# Patient Record
Sex: Male | Born: 1945 | Race: White | Hispanic: No | Smoking: Former smoker
Health system: Southern US, Community
[De-identification: ages and names within clinical notes are randomized; demographics above are authoritative.]

## PROBLEM LIST (undated history)

## (undated) DIAGNOSIS — Z720 Tobacco use: Secondary | ICD-10-CM

## (undated) DIAGNOSIS — I499 Cardiac arrhythmia, unspecified: Secondary | ICD-10-CM

## (undated) DIAGNOSIS — E785 Hyperlipidemia, unspecified: Secondary | ICD-10-CM

## (undated) DIAGNOSIS — S838X9A Sprain of other specified parts of unspecified knee, initial encounter: Secondary | ICD-10-CM

## (undated) DIAGNOSIS — Z9889 Other specified postprocedural states: Secondary | ICD-10-CM

## (undated) DIAGNOSIS — S83242A Other tear of medial meniscus, current injury, left knee, initial encounter: Secondary | ICD-10-CM

## (undated) DIAGNOSIS — I509 Heart failure, unspecified: Secondary | ICD-10-CM

## (undated) DIAGNOSIS — I2510 Atherosclerotic heart disease of native coronary artery without angina pectoris: Secondary | ICD-10-CM

## (undated) DIAGNOSIS — I739 Peripheral vascular disease, unspecified: Secondary | ICD-10-CM

## (undated) DIAGNOSIS — Z95828 Presence of other vascular implants and grafts: Secondary | ICD-10-CM

## (undated) DIAGNOSIS — I252 Old myocardial infarction: Secondary | ICD-10-CM

## (undated) DIAGNOSIS — I10 Essential (primary) hypertension: Secondary | ICD-10-CM

## (undated) DIAGNOSIS — G709 Myoneural disorder, unspecified: Secondary | ICD-10-CM

## (undated) DIAGNOSIS — F329 Major depressive disorder, single episode, unspecified: Secondary | ICD-10-CM

## (undated) DIAGNOSIS — R0602 Shortness of breath: Secondary | ICD-10-CM

## (undated) DIAGNOSIS — F32A Depression, unspecified: Secondary | ICD-10-CM

## (undated) DIAGNOSIS — F419 Anxiety disorder, unspecified: Secondary | ICD-10-CM

## (undated) DIAGNOSIS — N289 Disorder of kidney and ureter, unspecified: Secondary | ICD-10-CM

## (undated) DIAGNOSIS — Z951 Presence of aortocoronary bypass graft: Secondary | ICD-10-CM

## (undated) DIAGNOSIS — M1990 Unspecified osteoarthritis, unspecified site: Secondary | ICD-10-CM

## (undated) HISTORY — DX: Hyperlipidemia, unspecified: E78.5

## (undated) HISTORY — DX: Essential (primary) hypertension: I10

## (undated) HISTORY — DX: Heart failure, unspecified: I50.9

## (undated) HISTORY — PX: EYE SURGERY: SHX253

## (undated) HISTORY — PX: CARDIAC CATHETERIZATION: SHX172

## (undated) HISTORY — PX: OTHER SURGICAL HISTORY: SHX169

## (undated) HISTORY — DX: Atherosclerotic heart disease of native coronary artery without angina pectoris: I25.10

## (undated) HISTORY — DX: Peripheral vascular disease, unspecified: I73.9

## (undated) HISTORY — DX: Tobacco use: Z72.0

## (undated) HISTORY — DX: Other specified postprocedural states: Z98.890

## (undated) HISTORY — PX: LUMBAR DISC SURGERY: SHX700

## (undated) HISTORY — DX: Shortness of breath: R06.02

## (undated) HISTORY — PX: JOINT REPLACEMENT: SHX530

## (undated) HISTORY — PX: CATARACT EXTRACTION W/ INTRAOCULAR LENS IMPLANT: SHX1309

## (undated) HISTORY — DX: Disorder of kidney and ureter, unspecified: N28.9

## (undated) HISTORY — PX: CATARACT EXTRACTION: SUR2

---

## 1999-03-20 ENCOUNTER — Encounter: Payer: Self-pay | Admitting: Specialist

## 1999-03-20 ENCOUNTER — Ambulatory Visit (HOSPITAL_COMMUNITY): Admission: RE | Admit: 1999-03-20 | Discharge: 1999-03-20 | Payer: Self-pay | Admitting: Specialist

## 2000-08-01 ENCOUNTER — Encounter: Payer: Self-pay | Admitting: Specialist

## 2000-08-01 ENCOUNTER — Ambulatory Visit (HOSPITAL_COMMUNITY): Admission: RE | Admit: 2000-08-01 | Discharge: 2000-08-01 | Payer: Self-pay | Admitting: Specialist

## 2000-11-14 ENCOUNTER — Encounter: Payer: Self-pay | Admitting: Specialist

## 2000-11-22 ENCOUNTER — Inpatient Hospital Stay (HOSPITAL_COMMUNITY): Admission: RE | Admit: 2000-11-22 | Discharge: 2000-11-24 | Payer: Self-pay | Admitting: Specialist

## 2009-01-05 DIAGNOSIS — I252 Old myocardial infarction: Secondary | ICD-10-CM

## 2009-01-05 HISTORY — DX: Old myocardial infarction: I25.2

## 2009-01-19 ENCOUNTER — Ambulatory Visit: Payer: Self-pay | Admitting: Internal Medicine

## 2009-01-19 ENCOUNTER — Inpatient Hospital Stay (HOSPITAL_COMMUNITY): Admission: EM | Admit: 2009-01-19 | Discharge: 2009-02-07 | Payer: Self-pay | Admitting: Cardiology

## 2009-01-19 ENCOUNTER — Ambulatory Visit: Payer: Self-pay | Admitting: Cardiovascular Disease

## 2009-01-19 ENCOUNTER — Ambulatory Visit: Payer: Self-pay | Admitting: Gastroenterology

## 2009-01-20 ENCOUNTER — Encounter: Payer: Self-pay | Admitting: Cardiology

## 2009-01-21 ENCOUNTER — Encounter: Payer: Self-pay | Admitting: Cardiovascular Disease

## 2009-01-22 ENCOUNTER — Encounter: Payer: Self-pay | Admitting: Cardiology

## 2009-01-22 DIAGNOSIS — Z9889 Other specified postprocedural states: Secondary | ICD-10-CM

## 2009-01-22 DIAGNOSIS — I10 Essential (primary) hypertension: Secondary | ICD-10-CM

## 2009-01-22 DIAGNOSIS — R0602 Shortness of breath: Secondary | ICD-10-CM | POA: Insufficient documentation

## 2009-01-22 DIAGNOSIS — I739 Peripheral vascular disease, unspecified: Secondary | ICD-10-CM

## 2009-01-23 ENCOUNTER — Encounter: Payer: Self-pay | Admitting: Internal Medicine

## 2009-01-24 ENCOUNTER — Encounter: Payer: Self-pay | Admitting: Internal Medicine

## 2009-01-26 ENCOUNTER — Encounter: Payer: Self-pay | Admitting: Cardiology

## 2009-01-26 ENCOUNTER — Encounter: Payer: Self-pay | Admitting: Gastroenterology

## 2009-01-31 ENCOUNTER — Encounter: Payer: Self-pay | Admitting: Cardiovascular Disease

## 2009-01-31 ENCOUNTER — Ambulatory Visit: Payer: Self-pay | Admitting: Surgery

## 2009-01-31 ENCOUNTER — Encounter: Payer: Self-pay | Admitting: Physician Assistant

## 2009-01-31 ENCOUNTER — Telehealth (INDEPENDENT_AMBULATORY_CARE_PROVIDER_SITE_OTHER): Payer: Self-pay | Admitting: *Deleted

## 2009-02-01 ENCOUNTER — Encounter: Payer: Self-pay | Admitting: Cardiology

## 2009-02-02 ENCOUNTER — Encounter: Payer: Self-pay | Admitting: Gastroenterology

## 2009-02-03 ENCOUNTER — Encounter: Payer: Self-pay | Admitting: Physician Assistant

## 2009-02-03 HISTORY — PX: CORONARY ARTERY BYPASS GRAFT: SHX141

## 2009-02-22 ENCOUNTER — Encounter (INDEPENDENT_AMBULATORY_CARE_PROVIDER_SITE_OTHER): Payer: Self-pay | Admitting: *Deleted

## 2009-02-28 ENCOUNTER — Ambulatory Visit: Payer: Self-pay | Admitting: Cardiovascular Disease

## 2009-02-28 DIAGNOSIS — F17200 Nicotine dependence, unspecified, uncomplicated: Secondary | ICD-10-CM | POA: Insufficient documentation

## 2009-02-28 DIAGNOSIS — I2510 Atherosclerotic heart disease of native coronary artery without angina pectoris: Secondary | ICD-10-CM

## 2009-02-28 DIAGNOSIS — I10 Essential (primary) hypertension: Secondary | ICD-10-CM

## 2009-03-01 ENCOUNTER — Encounter: Admission: RE | Admit: 2009-03-01 | Discharge: 2009-03-01 | Payer: Self-pay | Admitting: Surgery

## 2009-03-07 ENCOUNTER — Ambulatory Visit: Payer: Self-pay | Admitting: Surgery

## 2009-03-08 ENCOUNTER — Telehealth (INDEPENDENT_AMBULATORY_CARE_PROVIDER_SITE_OTHER): Payer: Self-pay | Admitting: *Deleted

## 2009-03-09 ENCOUNTER — Telehealth: Payer: Self-pay | Admitting: Cardiovascular Disease

## 2009-04-01 ENCOUNTER — Telehealth: Payer: Self-pay | Admitting: Cardiovascular Disease

## 2009-08-23 ENCOUNTER — Encounter (INDEPENDENT_AMBULATORY_CARE_PROVIDER_SITE_OTHER): Payer: Self-pay | Admitting: *Deleted

## 2009-09-04 ENCOUNTER — Encounter: Admission: RE | Admit: 2009-09-04 | Discharge: 2009-09-04 | Payer: Self-pay | Admitting: Orthopedic Surgery

## 2009-09-06 ENCOUNTER — Ambulatory Visit: Payer: Self-pay | Admitting: Cardiovascular Disease

## 2009-10-21 ENCOUNTER — Ambulatory Visit: Payer: Self-pay

## 2009-10-21 ENCOUNTER — Encounter: Payer: Self-pay | Admitting: Cardiovascular Disease

## 2010-04-03 ENCOUNTER — Ambulatory Visit: Payer: Self-pay | Admitting: Physician Assistant

## 2010-04-03 DIAGNOSIS — E785 Hyperlipidemia, unspecified: Secondary | ICD-10-CM

## 2010-04-04 ENCOUNTER — Encounter: Payer: Self-pay | Admitting: Physician Assistant

## 2010-04-19 ENCOUNTER — Encounter: Payer: Self-pay | Admitting: Cardiovascular Disease

## 2010-04-19 ENCOUNTER — Ambulatory Visit: Payer: Self-pay | Admitting: Cardiovascular Disease

## 2010-04-27 ENCOUNTER — Encounter: Payer: Self-pay | Admitting: Cardiovascular Disease

## 2010-05-15 ENCOUNTER — Ambulatory Visit: Admission: RE | Admit: 2010-05-15 | Discharge: 2010-05-15 | Payer: Self-pay | Source: Home / Self Care

## 2010-05-15 ENCOUNTER — Other Ambulatory Visit: Payer: Self-pay | Admitting: Cardiovascular Disease

## 2010-05-15 LAB — CBC WITH DIFFERENTIAL/PLATELET
Basophils Absolute: 0.1 10*3/uL (ref 0.0–0.1)
Basophils Relative: 0.7 % (ref 0.0–3.0)
Eosinophils Absolute: 0.1 10*3/uL (ref 0.0–0.7)
Eosinophils Relative: 1.3 % (ref 0.0–5.0)
HCT: 38 % — ABNORMAL LOW (ref 39.0–52.0)
Hemoglobin: 12.6 g/dL — ABNORMAL LOW (ref 13.0–17.0)
Lymphocytes Relative: 30.8 % (ref 12.0–46.0)
Lymphs Abs: 2.2 10*3/uL (ref 0.7–4.0)
MCHC: 33 g/dL (ref 30.0–36.0)
MCV: 84.2 fl (ref 78.0–100.0)
Monocytes Absolute: 0.9 10*3/uL (ref 0.1–1.0)
Monocytes Relative: 12.4 % — ABNORMAL HIGH (ref 3.0–12.0)
Neutro Abs: 3.9 10*3/uL (ref 1.4–7.7)
Neutrophils Relative %: 54.8 % (ref 43.0–77.0)
Platelets: 296 10*3/uL (ref 150.0–400.0)
RBC: 4.52 Mil/uL (ref 4.22–5.81)
RDW: 17.8 % — ABNORMAL HIGH (ref 11.5–14.6)
WBC: 7 10*3/uL (ref 4.5–10.5)

## 2010-05-15 LAB — BASIC METABOLIC PANEL
BUN: 13 mg/dL (ref 6–23)
CO2: 29 mEq/L (ref 19–32)
Calcium: 9.3 mg/dL (ref 8.4–10.5)
Chloride: 102 mEq/L (ref 96–112)
Creatinine, Ser: 1.1 mg/dL (ref 0.4–1.5)
GFR: 68.63 mL/min (ref >60.00)
Glucose, Bld: 122 mg/dL — ABNORMAL HIGH (ref 70–99)
Potassium: 3.9 mEq/L (ref 3.5–5.1)
Sodium: 137 mEq/L (ref 135–145)

## 2010-05-15 LAB — PROTIME-INR
INR: 1 ratio (ref 0.8–1.0)
Prothrombin Time: 10.9 s (ref 10.2–12.4)

## 2010-05-17 ENCOUNTER — Ambulatory Visit (HOSPITAL_COMMUNITY)
Admission: RE | Admit: 2010-05-17 | Discharge: 2010-05-17 | Payer: Self-pay | Source: Home / Self Care | Attending: Cardiovascular Disease | Admitting: Cardiovascular Disease

## 2010-05-19 ENCOUNTER — Encounter: Payer: Self-pay | Admitting: Cardiovascular Disease

## 2010-05-19 ENCOUNTER — Telehealth (INDEPENDENT_AMBULATORY_CARE_PROVIDER_SITE_OTHER): Payer: Self-pay | Admitting: *Deleted

## 2010-05-22 LAB — GLUCOSE, CAPILLARY
Glucose-Capillary: 142 mg/dL — ABNORMAL HIGH (ref 70–99)
Glucose-Capillary: 112 mg/dL — ABNORMAL HIGH (ref 70–99)
Glucose-Capillary: 179 mg/dL — ABNORMAL HIGH (ref 70–99)

## 2010-05-23 ENCOUNTER — Encounter: Payer: Self-pay | Admitting: Cardiovascular Disease

## 2010-05-26 ENCOUNTER — Ambulatory Visit (HOSPITAL_COMMUNITY)
Admission: RE | Admit: 2010-05-26 | Discharge: 2010-05-26 | Payer: Self-pay | Source: Home / Self Care | Attending: Cardiovascular Disease | Admitting: Cardiovascular Disease

## 2010-05-26 HISTORY — PX: RENAL ARTERY ANGIOPLASTY: SHX2317

## 2010-05-29 ENCOUNTER — Ambulatory Visit: Admit: 2010-05-29 | Payer: Self-pay | Admitting: Cardiovascular Disease

## 2010-05-29 LAB — URINALYSIS, ROUTINE W REFLEX MICROSCOPIC
Ketones, ur: NEGATIVE mg/dL
Urine Glucose, Fasting: NEGATIVE mg/dL
pH: 6.5 (ref 5.0–8.0)

## 2010-05-29 LAB — CBC
HCT: 36.5 % — ABNORMAL LOW (ref 39.0–52.0)
Hemoglobin: 11.6 g/dL — ABNORMAL LOW (ref 13.0–17.0)
MCH: 26.3 pg (ref 26.0–34.0)
MCHC: 31.8 g/dL (ref 30.0–36.0)
RBC: 4.41 MIL/uL (ref 4.22–5.81)
RDW: 16.6 % — ABNORMAL HIGH (ref 11.5–15.5)

## 2010-05-29 LAB — DIFFERENTIAL
Basophils Absolute: 0 10*3/uL (ref 0.0–0.1)
Basophils Relative: 0 % (ref 0–1)
Lymphs Abs: 2.1 10*3/uL (ref 0.7–4.0)
Monocytes Absolute: 0.6 10*3/uL (ref 0.1–1.0)
Monocytes Relative: 12 % (ref 3–12)
Neutro Abs: 2.3 10*3/uL (ref 1.7–7.7)

## 2010-05-29 LAB — BLOOD GAS, ARTERIAL
Patient temperature: 98.6
TCO2: 27.7 mmol/L (ref 0–100)

## 2010-05-29 LAB — COMPREHENSIVE METABOLIC PANEL
AST: 19 U/L (ref 0–37)
Alkaline Phosphatase: 48 U/L (ref 39–117)
BUN: 9 mg/dL (ref 6–23)
Creatinine, Ser: 1.04 mg/dL (ref 0.4–1.5)
Glucose, Bld: 99 mg/dL (ref 70–99)
Total Bilirubin: 0.5 mg/dL (ref 0.3–1.2)

## 2010-05-29 LAB — PROTIME-INR
INR: 1 (ref 0.00–1.49)
Prothrombin Time: 13.4 seconds (ref 11.6–15.2)

## 2010-05-29 LAB — APTT: aPTT: 64 seconds — ABNORMAL HIGH (ref 24–37)

## 2010-05-31 ENCOUNTER — Inpatient Hospital Stay (HOSPITAL_COMMUNITY)
Admission: RE | Admit: 2010-05-31 | Discharge: 2010-06-03 | Payer: Self-pay | Source: Home / Self Care | Attending: Vascular Surgery | Admitting: Vascular Surgery

## 2010-05-31 HISTORY — PX: OTHER SURGICAL HISTORY: SHX169

## 2010-05-31 LAB — GLUCOSE, CAPILLARY
Glucose-Capillary: 119 mg/dL — ABNORMAL HIGH (ref 70–99)
Glucose-Capillary: 137 mg/dL — ABNORMAL HIGH (ref 70–99)

## 2010-05-31 NOTE — Procedures (Addendum)
NAME:  Austin Diaz, Austin Diaz NO.:  [id number]  MEDICAL RECORD NO.:  [id number]          PATIENT TYPE:  AMB  LOCATION:  SDS                          FACILITY:  [facility]  PHYSICIAN:  Verne Carrow, MDDATE OF BIRTH:  1945-05-18  DATE OF PROCEDURE:  09/21/1945 DATE OF DISCHARGE:  05/26/2010                   PERIPHERAL VASCULAR INVASIVE PROCEDURE   PRIMARY CARE PHYSICIAN:  Dr. Olena Leatherwood at [facility].  PRIMARY CARDIOLOGIST:  Learta Codding, MD,FACC  PROCEDURES PERFORMED:  Percutaneous transluminal angioplasty with placement of a stent in the right common iliac artery.  OPERATOR:  Verne Carrow, MD  INDICATIONS:  This is a 65 year old Caucasian male with a history of hypertension, coronary artery disease, status post two-vessel coronary artery bypass grafting surgery in September 2010 and severe peripheral vascular disease who has undergone prior renal artery bypass as well as bilateral carotid endarterectomy who I saw in the office last year with complaints of lower extremity pain with ambulation.  The patient's pain is recently worsened.  His noninvasive studies showed severe stable disease with ABI of 0.3 on the left and 0.7 on the right.  We brought him in last week for a distal aortogram with bilateral lower extremity runoff.  He was found to have severe stenosis in the right common iliac artery and a total occlusion of a left common iliac artery.  Dr. Jerilee Field from Vascular Surgery performed a consultation on the patient today of his lower extremity runoff.  He felt that the most likely thing that would benefit this patient would be right to left femoral to femoral bypass surgery with left femoral to popliteal bypass.  Dr. Candie Chroman daughter would also be best if I brought this gentleman back today to perform stenting procedure of the right common iliac artery. His bypass surgery is planned for May 31, 2010, per Dr.  [name].  DETAILS OF PROCEDURE:  The patient was brought to the main peripheral vascular laboratory after signing informed consent for the procedure. The right groin was prepped and draped in sterile fashion.  Lidocaine 1% was used for local anesthesia.  A 7-French sheath was inserted into the right femoral artery without difficulty.  We initially placed a pigtail catheter in the distal aorta, performed angiography of the bilateral iliac arteries.  As stated before, the left common iliac artery is occluded at the ostium.  The right common iliac artery has high-grade disease in the proximal portion just beyond the ostium.  After initial angiography was performed, we then carefully positioned a 7.0 mm x 28 mm Cordis genesis stent.  This was a balloon expandable stent that was deployed without difficulty.  There was an excellent angiographic result with no evidence of dissection or edge-vessel tears.  We performed a pressure pullback both proximal and distal to the stented segment and there was no pressure gradient.  The patient tolerated the procedure well.  Of note, we did use 5000 units of intravenous heparin initially. ACT was 210 just before the stenting procedure.  The patient was given additional 2000 units of intravenous heparin.  The patient has taken to the recovery room in stable condition.  HEMODYNAMIC FINDINGS:  Central aortic pressure 119/53.  IMPRESSION:  Severe peripheral arterial disease with successful stenting of the right common iliac artery.  PLAN:  For Dr. Jerilee Field to perform right to left femoral to femoral bypass with left femoral to left popliteal bypass on May 31, 2010.  RECOMMENDATIONS:  The patient will be continued on aspirin 325 mg once daily.  We will not start him on Plavix as he has an upcoming surgery in 6 days.  We will watch the patient closely after the procedure today and if he has no vascular complications from arterial access, we  will discharge him home tonight.     Verne Carrow, MD     CM/MEDQ  D:  05/26/2010  T:  05/27/2010  Job:  [id number]  cc:   Dr. Elder Love, MD,FACC  Electronically Signed by Verne Carrow MD on 05/31/2010 04:11:04 PM

## 2010-06-01 LAB — BASIC METABOLIC PANEL
Calcium: 7.6 mg/dL — ABNORMAL LOW (ref 8.4–10.5)
GFR calc non Af Amer: 56 mL/min — ABNORMAL LOW (ref >60)
Glucose, Bld: 94 mg/dL (ref 70–99)
Potassium: 3.7 mEq/L (ref 3.5–5.1)
Sodium: 134 mEq/L — ABNORMAL LOW (ref 135–145)

## 2010-06-01 LAB — GLUCOSE, CAPILLARY
Glucose-Capillary: 142 mg/dL — ABNORMAL HIGH (ref 70–99)
Glucose-Capillary: 83 mg/dL (ref 70–99)
Glucose-Capillary: 90 mg/dL (ref 70–99)
Glucose-Capillary: 131 mg/dL — ABNORMAL HIGH (ref 70–99)

## 2010-06-01 LAB — CBC
HCT: 28.1 % — ABNORMAL LOW (ref 39.0–52.0)
MCHC: 32 g/dL (ref 30.0–36.0)
Platelets: 173 10*3/uL (ref 150–400)
RDW: 16.9 % — ABNORMAL HIGH (ref 11.5–15.5)
WBC: 7 10*3/uL (ref 4.0–10.5)

## 2010-06-01 LAB — POCT ACTIVATED CLOTTING TIME
Activated Clotting Time: 211 seconds
Activated Clotting Time: 199 seconds
Activated Clotting Time: 175 seconds
Activated Clotting Time: 175 seconds

## 2010-06-02 LAB — GLUCOSE, CAPILLARY
Glucose-Capillary: 87 mg/dL (ref 70–99)
Glucose-Capillary: 90 mg/dL (ref 70–99)

## 2010-06-03 LAB — CROSSMATCH
ABO/RH(D): A POS
Antibody Screen: NEGATIVE
Unit division: 0
Unit division: 0

## 2010-06-03 LAB — GLUCOSE, CAPILLARY: Glucose-Capillary: 112 mg/dL — ABNORMAL HIGH (ref 70–99)

## 2010-06-05 NOTE — Op Note (Signed)
NAME:  Austin Diaz, Austin Diaz NO.:  [id number]  MEDICAL RECORD NO.:  [id number]          PATIENT TYPE:  INP  LOCATION:  3311                         FACILITY:  [facility]  PHYSICIAN:  Quita Skye. [name], M.D.  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  OPERATIONS: 1. Right external iliac and common femoral endarterectomy with Dacron     patch angioplasty. 2. Right-to-left femoral-femoral bypass using an 8-mm Hemashield     Dacron graft. 3. Left femoral-to-popliteal (above-knee) bypass using a non-reversed     translocated saphenous vein graft, left leg. 4. Intraoperative arteriogram, left leg.  SURGEON:  Quita Skye. [name], MD  FIRST ASSISTANT:  Della Goo, PA-C  ANESTHESIA:  General endotracheal.  PROCEDURE:  The patient was taken to the operating room, placed in the supine position, at which time, satisfactory general endotracheal anesthesia was administered.  The right groin and entire left leg were prepped with Betadine scrub and solution and draped in a routine sterile manner.  A longitudinal incision was made in the right inguinal area, common superficial and profunda femoris arteries were dissected free. There was diffuse calcific disease throughout the common femoral artery with a diminished pulse.  The patient had had a proximal common iliac angioplasty and stent procedure performed about 1 week previously. Attention was turned to the left leg where there was a longitudinal incision made in the groin.  The common superficial and profunda femoris arteries were dissected free.  Left common femoral artery did have a soft spot anteriorly, but it was pulseless.   Saphenous vein was exposed at the saphenofemoral junction, dissected free to the knee through multiple incisions along the medial aspect of the left leg.  Its branch was ligated with a 4-0 silk ties and divided.  It was removed gently, dilated with heparinized saline, and marked for orientation purposes. Popliteal artery was exposed in the above-knee position where it was a soft vessel and widely patent on the angiogram.  A subfascial anatomic tunnel and a suprapubic tunnel were created and the patient was heparinized.  Attention was turned to the right side where the external iliac was occluded well above the inguinal ligament and the superficial femoral and profunda were occluded distally.  Longitudinal opening made in the common femoral artery, vessel much too diseased to perform a femoral-femoral bypass without performing endarterectomy first. Therefore, longitudinal incision was extended up above the inguinal ligament.  Extensive endarterectomy performed in the common femoral and external iliac artery up to the proximal clamp which greatly improved the inflow.  After all loose debris was removed and the distal intima was tacked down with several 6-0 Prolene sutures, Dacron patch was sewn into place with 5-0 Prolene.  Distal opening made in the patch and an 8- mm Hemashield Dacron graft had been tunneled suprapubically, was slightly spatulated and anastomosed end-to-side to the right common femoral patch with 5-0 Prolene.  Attention was turned to the left side where the femoral vessels were occluded.  A longitudinal opening made in the common femoral and the soft area extended down to the origin of the profunda.  It had very sluggish inflow.  The left side of the femoral- femoral graft spatulated and anastomosed end-to-side with 5-0 Prolene. Clamps were then released.  There was a good pulse in the femoral- femoral graft.  Graft was re-occluded, longitudinal opening made in the very  distal end of the femoral-femoral graft on the left side, opened with 15 blade, extended with a Potts scissors.  The proximal end of the saphenous vein was anastomosed end-to-side with 6-0 Prolene.  Clamps were released and there was an excellent pulse down the first set of competent valves.  Using retrograde valvulotome, valves were rendered incompetent with resultant excellent flow out of the distal end of the vein graft.  Vein was carefully delivered through the tunnel, popliteal artery above the knee occluded proximally and distally, opened with 15 blade, extended with a Potts scissors and had very good backbleeding. Vein was carefully measured, spatulated, and anastomosed end-to-side with 6-0 Prolene.  Clamps were released and there was an excellent pulse and good Doppler flow not only in the popliteal artery, but in the posterior tibial artery at the ankle.  Intraoperative arteriogram revealed a widely patent anastomosis with the best runoff vessel being the posterior tibial, but the anterior tib and peroneals also being patent.  Adequate hemostasis was achieved.  Protamine given to reverse the heparin.  Following adequate hemostasis, wound was irrigated with saline, closed in layers with Vicryl in subcuticular fashion with Dermabond.  The patient was taken to the recovery room in stable condition.     Quita Skye [name], M.D.     JDL/MEDQ  D:  05/31/2010  T:  06/01/2010  Job:  [id number]  Electronically Signed by Josephina Gip M.D. on 06/05/2010 10:31:09 AM

## 2010-06-06 ENCOUNTER — Telehealth (INDEPENDENT_AMBULATORY_CARE_PROVIDER_SITE_OTHER): Payer: Self-pay | Admitting: *Deleted

## 2010-06-08 NOTE — Assessment & Plan Note (Signed)
Summary: 6 month rov/sl   Visit Type:  Follow-up Primary Provider:  Dr. Robynn Pane.[facility]  CC:  no complaints.  History of Present Illness: 65 yo WM with history of HTN, CAD s/p 2V CABG 9/10 (LIMA to LAD, SVG to OM)  and PVD with prior Bilateral CEA here today for follow up. I saw him for hospital follow up in October 2010 after he was admitted to [facility] for NSTEMI and underwent the 2V CABG.   He has been doing well from a  cardiac standpoint post CABG in September. Pre-CABG dopplers suggested severe disease in the left lower extremity and moderate disease in the right lower ext. At the first visit, I  scheduled lower ext arterial dopplers and carotid dopplers. He somehow missed the appt for testing (was never called by office).and missed his f/u appt with me in April.  He tells me that he had a cortisone shot in his left hip two weeks ago and his left leg feels better. From a cardiac standpoint, he is doing well. No chest pain, SOB, dizziness, near syncope or syncope. He continues to smoke. He lives in [place] and wishes to be followed in [place].   Problems Prior to Update: 1)  Essential Hypertension, Benign  (ICD-401.1) 2)  Coronary Atherosclerosis of Artery Bypass Graft  (ICD-414.04) 3)  Tobacco Abuse  (ICD-305.1) 4)  Carotid Endarterectomy, Bilateral, Hx of  (ICD-V15.1) 5)  Peripheral Vascular Disease  (ICD-443.9) 6)  Shortness of Breath  (ICD-786.05) 7)  Hypertension, Unspecified  (ICD-401.9)  Current Medications (verified): 1)  Aspirin Ec 325 Mg Tbec (Aspirin) .... Take One Tablet By Mouth Daily 2)  Lisinopril 20 Mg Tabs (Lisinopril) .... Take One Tablet By Mouth Daily 3)  Crestor 40 Mg Tabs (Rosuvastatin Calcium) .... Take One Tablet By Mouth Daily. 4)  Diazepam 10 Mg Tabs (Diazepam) .... As Needed 5)  Glipizide Xl 5 Mg Xr24h-Tab (Glipizide) .... Take 1/2 Tablet Two Times A Day 6)  Prosvent (For Prostate) .Marland Kitchen.. 1 Cap Two Times A Day 7)  Nitrostat 0.4 Mg Subl  (Nitroglycerin) .Marland Kitchen.. 1 Tablet Under Tongue At Onset of Chest Pain; You May Repeat Every 5 Minutes For Up To 3 Doses.  Allergies: 1)  ! * Morphine  Past History:  Past Medical History: Current Problems:  CAROTID ENDARTERECTOMY, BILATERAL, HX OF (ICD-V15.1) PERIPHERAL VASCULAR DISEASE (ICD-443.9)-non invasive studies 9/10 with reduced ABI on left and right SHORTNESS OF BREATH (ICD-786.05) HYPERTENSION, UNSPECIFIED (ICD-401.9)  Nonfunctioning right kidney. CAD s/p 2 V CABG 9/10. (LIMA to LAD, SVG to OM) Tobacco abuse  Past Surgical History: Reviewed history from 02/28/2009 and no changes required.  bilateral carotid endarterectomy  right renal  artery bypass surgery.   left hip replacement x 3, right hip replacement x 2  lumbar microdiskectomy.  2V CABG 9/10 (LIMA to LAD, SVG to OM)  Family History: Reviewed history from 02/28/2009 and no changes required. His mother is deceased in her 24s of massive stroke.  She also had osteoporosis.  Father died at age 72 after broken hip and PE.  Social History: Reviewed history from 02/28/2009 and no changes required. The patient has a history of tobacco use. He currently smokes 1pack every three days.  He drinks beer several times per week.  No illicit drugs Single 1 child  Review of Systems  The patient denies fatigue, malaise, fever, weight gain/loss, vision loss, decreased hearing, hoarseness, chest pain, palpitations, shortness of breath, prolonged cough, wheezing, sleep apnea, coughing up blood, abdominal pain, blood  in stool, nausea, vomiting, diarrhea, heartburn, incontinence, blood in urine, muscle weakness, joint pain, leg swelling, rash, skin lesions, headache, fainting, dizziness, depression, anxiety, enlarged lymph nodes, easy bruising or bleeding, and environmental allergies.    Vital Signs:  Patient profile:   65 year old male Height:      68 inches Weight:      197 pounds Pulse rate:   104 / minute BP sitting:   130  / 75  (left arm) Cuff size:   large  Vitals Entered By: Oswald Hillock (Sep 06, 2009 2:36 PM)  Physical Exam  General:  General: Well developed, well nourished, NAD HEENT: OP clear, mucus membranes moist SKIN: warm, dry Neuro: No focal deficits Musculoskeletal: Muscle strength 5/5 all ext Psychiatric: Mood and affect normal Neck: No JVD, Right  carotid bruit. No left carotid bruit, no thyromegaly, no lymphadenopathy. Lungs:Clear bilaterally, no wheezes, rhonci, crackles CV: Tachycardic. No murmurs, gallops rubs Abdomen: soft, NT, ND, BS present Extremities: No edema, pedal pulses difficult to palpate    EKG  Procedure date:  09/06/2009  Findings:      NSR, rate 99 bpm. PVC. LAE. Non-specific T wave abnormalities.   Impression & Recommendations:  Problem # 1:  CORONARY ATHEROSCLEROSIS OF ARTERY BYPASS GRAFT (ICD-414.04) Stable post CABG with no chest pain. Continue ASA, Lisinopril and statin. Will start Coreg (did not tolerate Metoprolol secondary to fatigue). He will follow up for his cardiac issues in the [facility] clinic.   His updated medication list for this problem includes:    Aspirin Ec 325 Mg Tbec (Aspirin) .Marland Kitchen... Take one tablet by mouth daily    Lisinopril 20 Mg Tabs (Lisinopril) .Marland Kitchen... Take one tablet by mouth daily    Nitrostat 0.4 Mg Subl (Nitroglycerin) .Marland Kitchen... 1 tablet under tongue at onset of chest pain; you may repeat every 5 minutes for up to 3 doses.    Carvedilol 3.125 Mg Tabs (Carvedilol) .Marland Kitchen... Take one tablet by mouth twice a day  Problem # 2:  ESSENTIAL HYPERTENSION, BENIGN (ICD-401.1) BP elevated on initil check today but normal on repeat. He did not tolerate Lopressor secondary to fatigue. Will start Coreg 3.125 mg by mouth two times a day. He will let us know if he does not tolerate.   His updated medication list for this problem includes:    Aspirin Ec 325 Mg Tbec (Aspirin) .Marland Kitchen... Take one tablet by mouth daily    Lisinopril 20 Mg Tabs  (Lisinopril) .Marland Kitchen... Take one tablet by mouth daily    Carvedilol 3.125 Mg Tabs (Carvedilol) .Marland Kitchen... Take one tablet by mouth twice a day  Problem # 3:  TOBACCO ABUSE (ICD-305.1) Smoking cessation counseling. He does not wish to stop smoking at this time.   Problem # 4:  PERIPHERAL VASCULAR DISEASE (ICD-443.9) Will schedule lower extremity arterial dopplers and call pt to review results. I will see him back in one year for PV follow up unless there is a more urgent need for repeat visit.   Problem # 5:  CAROTID ENDARTERECTOMY, BILATERAL, HX OF (ICD-V15.1) Will schedule carotid artery dopplers.   Orders: Carotid Duplex (Carotid Duplex)  Other Orders: Arterial Duplex Lower Extremity (Arterial Duplex Low)  Patient Instructions: 1)  Your physician recommends that you schedule a follow-up appointment in: 1 year with Dr. Clifton James here in [place]. 6 months in [place] with Dr. Andee Lineman or Dr. Diona Browner 2)  Your physician has recommended you make the following change in your medication: Start carvedilol 3.125 mg by mouth  two times a day 3)  Your physician has requested that you have a carotid duplex. This test is an ultrasound of the carotid arteries in your neck. It looks at blood flow through these arteries that supply the brain with blood. Allow one hour for this exam. There are no restrictions or special instructions. 4)  Your physician has requested that you have a lower or upper extremity arterial duplex.  This test is an ultrasound of the arteries in the legs or arms.  It looks at arterial blood flow in the legs and arms.  Allow one hour for Lower and Upper Arterial scans. There are no restrictions or special instructions. Prescriptions: CARVEDILOL 3.125 MG TABS (CARVEDILOL) Take one tablet by mouth twice a day  #60 x 11   Entered by:   Dossie Arbour, RN, BSN   Authorized by:   Verne Carrow, MD   Signed by:   Dossie Arbour, RN, BSN on 09/06/2009   Method used:   Electronically to         [facility]* (retail)       [address]       [place], [place]  [address]       Ph: [phone]       Fax: [phone]   RxID:   [id number]

## 2010-06-08 NOTE — Medication Information (Signed)
Summary: Chantix  Chantix   Imported By: Cyril Loosen, RN, BSN 04/04/2010 14:31:16  _____________________________________________________________________  External Attachment:    Type:   Image     Comment:   External Document  Appended Document: Chantix Attempted to reach pt. No answer, no voicemail.   Appended Document: Chantix Left message to call back on voicemail.  Appended Document: Chantix Pt notified and verbalized understanding.   Clinical Lists Changes  Medications: Removed medication of CHANTIX STARTING MONTH PAK 0.5 MG X 11 & 1 MG X 42 TABS (VARENICLINE TARTRATE) Take daily as directed Removed medication of CHANTIX CONTINUING MONTH PAK 1 MG TABS (VARENICLINE TARTRATE) Take daily as directed.

## 2010-06-08 NOTE — Op Note (Signed)
Summary: Operative Report/ [name]  Operative Report/ [name]   Imported By: Dorise Hiss 03/20/2010 14:24:39  _____________________________________________________________________  External Attachment:    Type:   Image     Comment:   External Document

## 2010-06-08 NOTE — Letter (Signed)
Summary: Peripheral Vascular  [facility], Main Office  [address]   [place], [place] [address]   Phone: [phone]  Fax: [phone]     05/19/2010 MRN: [id number]  Austin Diaz [address] [place], [place]  [address]  Dear Mr. VELARDI,   You are scheduled for Peripheral Vascular Angiogram on 05/26/10              with Dr. Clifton James.  Please arrive at the [facility] at 7:00      a.m. on the day of your procedure.  1. DIET     __x__ Nothing to eat or drink after midnight except your medications with a sip of water.  2. Go to the [facility] in [place] on 05/23/10 to have your blood work.   3. MAKE SURE YOU TAKE YOUR ASPIRIN.  4. __x___ DO NOT TAKE these medications before your procedure:         Hold your Glipizide the morning of your procedure.      __x__ YOU MAY TAKE ALL of your remaining medications with a small amount of water.   5. Plan for one night stay - bring personal belongings (i.e. toothpaste, toothbrush, etc.)  6. Bring a current list of your medications and current insurance cards.  7. Must have a responsible person to drive you home.   8. Someone must be with you for the first 24 hours after you arrive home.  9. Please wear clothes that are easy to get on and off and wear slip-on shoes.  *Special note: Every effort is made to have your procedure done on time.  Occasionally there are emergencies that present themselves at the hospital that may cause delays.  Please be patient if a delay does occur.  If you have any questions after you get home, please call the office at the number listed above.   Whitney Maeola Sarah RN

## 2010-06-08 NOTE — Assessment & Plan Note (Signed)
Summary: severe PAD per Boneta Lucks in [place] office/lg   Visit Type:  Follow-up Primary Provider:  Dr. Robynn Pane.[facility]  CC:  pain in legs .  History of Present Illness: 65 yo WM with history of HTN, CAD s/p 2V CABG 9/10 (LIMA to LAD, SVG to OM)  and PVD with prior Bilateral CEA here today for follow up. I saw him for hospital follow up in October 2010 after he was admitted to [facility] for NSTEMI and underwent the 2V CABG.   He has been doing well from a  cardiac standpoint post CABG and has had his cardiac issues followed in [place]. Pre-CABG dopplers suggested severe disease in the left lower extremity and moderate disease in the right lower ext. At the first visit in October 2010 I  scheduled lower ext arterial dopplers and carotid dopplers. He missed the appt for these tests so I rescheduled after seeing him in may 2011. The lower extremity dopplers in June 2011 suggested severe but stable disease with ABI of 0.36 on the left and 0.70 on the right. The right ATA and PTA waveforms were biphasic. The left ATA and PTA waveforms were monophasic. He was recently seen by Gene Serpe, PA-C in the [facility] cardiology office. He has done well from a cardiac standpoint but his legs continue to ache when walking and with rest. His pain is most severe in the left leg.   Current Medications (verified): 1)  Aspirin Ec 325 Mg Tbec (Aspirin) .... Take One Tablet By Mouth Daily 2)  Lisinopril 20 Mg Tabs (Lisinopril) .... Take One Tablet By Mouth Daily 3)  Diazepam 10 Mg Tabs (Diazepam) .... As Needed 4)  Glipizide Xl 5 Mg Xr24h-Tab (Glipizide) .... Take 1/2 Tablet Two Times A Day 5)  Prosvent (For Prostate) .Marland Kitchen.. 1 Cap Two Times A Day 6)  Nitrostat 0.4 Mg Subl (Nitroglycerin) .Marland Kitchen.. 1 Tablet Under Tongue At Onset of Chest Pain; You May Repeat Every 5 Minutes For Up To 3 Doses. 7)  Pravachol 40 Mg Tabs (Pravastatin Sodium) .Marland Kitchen.. 1 Table By Mouth Once Daily  Allergies: 1)  ! * Morphine  Past History:  Past  Medical History: Current Problems:  CAROTID ENDARTERECTOMY, BILATERAL, HX OF (ICD-V15.1) PERIPHERAL VASCULAR DISEASE (ICD-443.9)-non invasive studies 6/11 with ABI 0.35 on left and 0.70 on right.  SHORTNESS OF BREATH (ICD-786.05) HYPERTENSION, UNSPECIFIED (ICD-401.9)  Nonfunctioning right kidney. CAD s/p 2 V CABG 9/10. (LIMA to LAD, SVG to OM) Tobacco abuse  Past Surgical History: Reviewed history from 02/28/2009 and no changes required.  bilateral carotid endarterectomy  right renal  artery bypass surgery.   left hip replacement x 3, right hip replacement x 2  lumbar microdiskectomy.  2V CABG 9/10 (LIMA to LAD, SVG to OM)  Family History: Reviewed history from 02/28/2009 and no changes required. His mother is deceased in her 9s of massive stroke.  She also had osteoporosis.  Father died at age 85 after broken hip and PE.  Social History: Reviewed history from 02/28/2009 and no changes required. The patient has a history of tobacco use. He currently smokes 1pack every three days.  He drinks beer several times per week.  No illicit drugs Single 1 child  Review of Systems  The patient denies fatigue, malaise, fever, weight gain/loss, vision loss, decreased hearing, hoarseness, chest pain, palpitations, shortness of breath, prolonged cough, wheezing, sleep apnea, coughing up blood, abdominal pain, blood in stool, nausea, vomiting, diarrhea, heartburn, incontinence, blood in urine, muscle weakness, joint pain, leg swelling,  rash, skin lesions, headache, fainting, dizziness, depression, anxiety, enlarged lymph nodes, easy bruising or bleeding, and environmental allergies.         Bilateral leg pain with ambulation, worse on the left.   Vital Signs:  Patient profile:   65 year old male Height:      69 inches Weight:      197 pounds BMI:     29.20 Pulse rate:   95 / minute Resp:     14 per minute BP sitting:   136 / 78  (left arm) Cuff size:   large  Vitals Entered By:  Haze Boyden, CMA (April 19, 2010 2:02 PM)  Physical Exam  General:  General: Well developed, well nourished, NAD HEENT: OP clear, mucus membranes moist SKIN: warm, dry Neuro: No focal deficits Musculoskeletal: Muscle strength 5/5 all ext Psychiatric: Mood and affect normal Neck: No JVD, no carotid bruits, no thyromegaly, no lymphadenopathy. Lungs:Clear bilaterally, no wheezes, rhonci, crackles CV: RRR no murmurs, gallops rubs Abdomen: soft, NT, ND, BS present Extremities: No edema, pulses non-palpable bilateral DP/PT.     EKG  Procedure date:  04/19/2010  Findings:      NSR, rate 95 bpm.  T wave inversions inferior and lateral leads.   Impression & Recommendations:  Problem # 1:  PERIPHERAL VASCULAR DISEASE (ICD-443.9) Pt with known severe disease by u/s with recent worsening of symptoms. Some rest pain in left leg. Will arrange distal aortogram with bilateral lower ext runoff on 05/17/10 with possible PTA. Will check BMET, CBC and coags week of procedure. Risks and benefits reviewed with pt.   Other Orders: EKG w/ Interpretation (93000) PV Procedure (PV Procedure)  Patient Instructions: 1)  Your physician recommends that you schedule a follow-up appointment in: 6 weeks.  2)  Your physician recommends that you return for lab work on 05/15/10. 3)  Your physician recommends that you continue on your current medications as directed. Please refer to the Current Medication list given to you today. 4)  Your physician has requested that you have a peripheral vascular angiogram. This exam is performed at the hospital. During this exam IV contrast is used to look at arterial blood flow.  Please review the information sheet given for details.

## 2010-06-08 NOTE — Progress Notes (Signed)
Summary: Records Request  Faxed Peripheral Vascular Invasive Procedure to [name] ([phone]). Austin Diaz  May 19, 2010 3:23 PM

## 2010-06-08 NOTE — Cardiovascular Report (Signed)
Summary: Cardiac Catheterization  Cardiac Catheterization   Imported By: Dorise Hiss 03/20/2010 14:26:28  _____________________________________________________________________  External Attachment:    Type:   Image     Comment:   External Document

## 2010-06-08 NOTE — Letter (Signed)
Summary: Peripheral Vascular  [facility], Main Office  [address]   [place], [place] [address]   Phone: [phone]  Fax: [phone]     04/19/2010 MRN: [id number]  Austin Diaz [address] [place], [place]  [address]  Dear Austin Diaz,   You are scheduled for Peripheral Vascular Angiogram on 05/17/10              with Dr. Clifton James.  Please arrive at the [facility] at 9:30  a.m. on the day of your procedure.  1. DIET     __x__ Nothing to eat or drink after midnight except your medications with a sip of water.  2. Come to the [place] office on 05/15/10  for lab work.  The lab at [facility] is open from 8:30 a.m. to 1:30 p.m. and 2:30 p.m. to 5:00 p.m.  The lab at [facility] is open from 7:30 a.m. to 5:30 p.m.  You do not have to be fasting.  3. MAKE SURE YOU TAKE YOUR ASPIRIN.  4. __x___ DO NOT TAKE these medications before your procedure:         HOLD YOUR GLIPIZIDE the morning of your procedure.      __x__ YOU MAY TAKE ALL of your remaining medications with a small amount of water.      5. Plan for one night stay - bring personal belongings (i.e. toothpaste, toothbrush, etc.)  6. Bring a current list of your medications and current insurance cards.  7. Must have a responsible person to drive you home.   8. Someone must be with yu for the first 24 hours after you arrive home.  9. Please wear clothes that are easy to get on and off and wear slip-on shoes.  *Special note: Every effort is made to have your procedure done on time.  Occasionally there are emergencies that present themselves at the hospital that may cause delays.  Please be patient if a delay does occur.  If you have any questions after you get home, please call the office at the number listed above.   Whitney Maeola Sarah RN

## 2010-06-08 NOTE — Assessment & Plan Note (Signed)
Summary: EPH -POST HOSP PER PATIENT   Visit Type:  Follow-up Primary Provider:  Dr. Robynn [facility]ane.[facility]   History of Present Illness: patient presents to establish with our cardiology group here in [place], for the first time.  His cardiac history is notable for NSTEMI, 9/10, with subsequent 2V CABG (LIMA-LAD; SVG-OM), EF 65% by catheterization. He reports today that he has done extremely well since his surgery, with no exertional angina pectoris.  Unfortunately, however, he remains plagued by severe bilateral leg pain, worse on the left. He has known PAD and had surveillance ABIs, 6/11, per Dr. Clifton James. These suggested stable disease (0.70 right; 0.36 left), and Dr. Clifton James recommended a repeat study in one year. Of note, this study also suggested probable right subclavian stenosis.  Patient also has remote history of bilateral CEA. Followup studies, 6/11, again per Dr. 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  Unfortunately, the patient continues to smoke, but appears interested in trying smoking cessation modalities.  Preventive Screening-Counseling & Management  Alcohol-Tobacco     Smoking Status: current     Packs/Day: 1.0     Year Started: since age 10  Current Medications (verified): 1)  Aspirin Ec 325 Mg Tbec (Aspirin) .... Take One Tablet By Mouth Daily 2)  Lisinopril 20 Mg Tabs (Lisinopril) .... Take One Tablet By Mouth Daily 3)  Crestor 40 Mg Tabs (Rosuvastatin Calcium) .... Take One Tablet By Mouth Daily. 4)  Diazepam 10 Mg Tabs (Diazepam) .... As Needed 5)  Glipizide Xl 5 Mg Xr24h-Tab (Glipizide) .... Take 1/2 Tablet Two Times A Day 6)  Prosvent (For Prostate) .Marland Kitchen.. 1 Cap Two Times A Day 7)  Nitrostat 0.4 Mg Subl (Nitroglycerin) .Marland Kitchen.. 1 Tablet Under Tongue At Onset of Chest Pain; You May Repeat Every 5 Minutes For Up To 3 Doses.  Allergies (verified): 1)  ! * Morphine  Past  History:  Past Medical History: Last updated: 09/06/2009 Current Problems:  CAROTID ENDARTERECTOMY, BILATERAL, HX OF (ICD-V15.1) PERIPHERAL VASCULAR DISEASE (ICD-443.9)-non invasive studies 9/10 with reduced ABI on left and right SHORTNESS OF BREATH (ICD-786.05) HYPERTENSION, UNSPECIFIED (ICD-401.9)  Nonfunctioning right kidney. CAD s/p 2 V CABG 9/10. (LIMA to LAD, SVG to OM) Tobacco abuse  Social History: Smoking Status:  current Packs/Day:  1.0  Review of Systems       No fevers, chills, hemoptysis, dysphagia, melena, hematocheezia, hematuria, rash, claudication, orthopnea, pnd, pedal edema. All other systems negative.   Vital Signs:  Patient profile:   65 year old male Height:      69 inches Weight:      196 pounds BMI:     29.05 Pulse rate:   86 / minute BP sitting:   144 / 73  (left arm) Cuff size:   regular  Vitals Entered By: Hoover Brunette, LPN (April 03, 2010 1:07 PM)  Nutrition Counseling: [name] BMI is greater than 25 and therefore counseled on weight management options. Is Patient Diabetic? Yes Comments post hospital c/o pain in left leg x last 8 months - getting worse (shin area)   Physical Exam  Additional Exam:  GEN: 65 year old male, no distress HEENT: NCAT,PERRLA,EOMI NECK: minimally palpable pulses, bilateral bruits; no JVD; no TM LUNGS: CTA bilaterally HEART: RRR (S1S2); no significant murmurs; no rubs; no gallops ABD: soft, NT; intact BS EXT: 1/4 right femoral, nonpalpable left femoral pulse; nonpalpable bilateral popliteal, PT/DP pulses; bilateral femoral bruits; no peripheral edema; no obvious ulceration; feet warm to  touch SKIN: warm, dry MUSC: no obvious deformity NEURO: A/O (x3)     Impression & Recommendations:  Problem # 1:  CORONARY ATHEROSCLEROSIS OF ARTERY BYPASS GRAFT (ICD-414.04)  clinically stable, with no presenting signs or symptoms of unstable angina pectoris. Patient states he has done very well, since undergoing CABG,  9/10. His salient complaint is that of severe bilateral leg pain, including at rest, and worse on the left. Will schedule early return followup with Dr. Clifton James, in our [facility] clinic, for reevaluation of his known PAD. Will otherwise have him establish here in our [facility] clinic with Dr. Andee Lineman, in 6 months. Will continue current medication regimen.  Problem # 2:  TOBACCO ABUSE (ICD-305.1)  patient appears motivated to stop smoking. We'll prescribe Chantix, to be taken as directed.  Problem # 3:  HYPERTENSION, UNSPECIFIED (ICD-401.9) Assessment: Comment Only  Problem # 4:  DYSLIPIDEMIA (ICD-272.4)  recommend aggressive management with target LDL 70 or less, if feasible. We'll request most recent lipid profile from [place] [facility] in [place].  Problem # 5:  CAROTID ENDARTERECTOMY, BILATERAL, HX OF (ICD-V15.1)  continue following with Dr. Clifton James, in [place] office, as previously scheduled.  Patient Instructions: 1)  Your physician wants you to follow-up in: 6 months. You will receive a reminder letter in the mail one-two months in advance. If you don't receive a letter, please call our office to schedule the follow-up appointment. 2)  You have been referred to Dr. Clifton James in our [facility] office on Wed, Apr 19, 2010 at 1:45pm. 3)  We will request your cholesterol labs from the [place] in [place]. 4)  Start Chantix as directed. Prescriptions: CHANTIX CONTINUING MONTH PAK 1 MG TABS (VARENICLINE TARTRATE) Take daily as directed.  #1 pack x 2   Entered by:   Cyril Loosen, RN, BSN   Authorized by:   Nelida Meuse, PA-C   Signed by:   Cyril Loosen, RN, BSN on 04/03/2010   Method used:   Electronically to        [facility]* (retail)       [address]       [place], [place]  [address]       Ph: [phone]       Fax: [phone]   RxID:   [id number] CHANTIX STARTING MONTH PAK 0.5 MG X 11 & 1 MG X 42 TABS (VARENICLINE TARTRATE) Take daily as directed  #1  pack x 0   Entered by:   Cyril Loosen, RN, BSN   Authorized by:   Nelida Meuse, PA-C   Signed by:   Cyril Loosen, RN, BSN on 04/03/2010   Method used:   Electronically to        [facility]* (retail)       [address]       [place], [place]  [address]       Ph: [phone]       Fax: [phone]   RxID:   [id number]  I have reviewed and approved all prescriptions at the time of the office visit. Nelida Meuse, PA-C  April 03, 2010 1:59 PM

## 2010-06-08 NOTE — Letter (Signed)
Summary: Appointment - Missed  [facility], Main Office  [address]   [place], [place] [address]   Phone: [phone]  Fax: [phone]     August 23, 2009 MRN: [id number]   Austin Diaz [address] [place], [place]  [address]   Dear [name],  Our records indicate you missed scheduling some appointments in our office and an office visit with Dr. Clifton James. I have tried several times to contact you and have been unable to reach you. It is very important that we reach you to reschedule these appointments. We look forward to participating in your health care needs. Please contact us at the number listed above at your earliest convenience to schedule these appointments.     Sincerely, Lela Laverta [place] Scheduling Team

## 2010-06-08 NOTE — Consult Note (Signed)
Summary: Consultation Report/ [name]  Consultation Report/ [name]   Imported By: Dorise Hiss 03/20/2010 14:22:58  _____________________________________________________________________  External Attachment:    Type:   Image     Comment:   External Document

## 2010-06-14 NOTE — Progress Notes (Signed)
  Request Received from [facility] sent to [facility]..they are Requesting Records [facility]  June 06, 2010 8:41 AM

## 2010-06-19 NOTE — Discharge Summary (Addendum)
  NAME:  Austin Diaz, WYNE NO.:  [id number]  MEDICAL RECORD NO.:  [id number]          PATIENT TYPE:  INP  LOCATION:  2040                         FACILITY:  [facility]  PHYSICIAN:  Quita Skye. [name], M.D.  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  He was seen by Dr. [name] and has felt that the patient should have external iliac and common femoral endarterectomies with right-to-left fem-fem bypass, and a left femoropopliteal bypass with saphenous vein.  He is admitted to the hospital for this procedure.  PAST MEDICAL HISTORY:  Significant for hypertension, hypocholesterolemia, and diabetes.  HOSPITAL COURSE:  The patient was taken to the operating room on May 31, 2010, for right external iliac and common femoral endarterectomy with Dacron patch angioplasty, right-to-left femoral-femoral bypass, and a left femoral popliteal above knee bypass using nonreversed translocated saphenous vein graft from the left leg.  Postoperatively, the patient did well.  He is ambulating up and about, remained afebrile. His vital signs were stable.  His pain was well controlled.  He was ambulating, voiding, and taking p.o., and he was discharged on June 03, 2010.  FINAL DIAGNOSES: 1. 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 2. Diabetes, hypertension, hypercholesterolemia were all stable and     controlled with his home medications.  DISPOSITION:  The  patient was discharged to home.  He will follow up with Dr. [name] in 3-4 weeks.  DISCHARGE MEDICATIONS: 1. Oxycodone 1-2 tablets every 4 hours as needed for pain. 2. Aspirin 325 mg daily. 3. Diazepam 10 mg twice daily as needed for anxiety. 4. Glipizide 5 mg 1/2 tablet twice daily. 5. Lisinopril 20 mg 2 tablets every morning and 1-1/2 tablets in the     evening. 6. Multivitamins daily. 7. Pravastatin 40 mg daily. 8. Prosvent over-the-counter supplement 2 capsules by mouth at     bedtime.     Della Goo, PA-C   ______________________________ Quita Skye [name], M.D.    RR/MEDQ  D:  06/16/2010  T:  06/17/2010  Job:  [id number]  Electronically Signed by Josephina Gip M.D. on 06/19/2010 04:15:07 PM Electronically Signed by Della Goo PA on 06/22/2010 02:20:30 PM

## 2010-06-27 ENCOUNTER — Encounter (INDEPENDENT_AMBULATORY_CARE_PROVIDER_SITE_OTHER): Payer: Medicare Other

## 2010-06-27 ENCOUNTER — Ambulatory Visit (INDEPENDENT_AMBULATORY_CARE_PROVIDER_SITE_OTHER): Payer: Medicare Other | Admitting: Vascular Surgery

## 2010-06-27 DIAGNOSIS — I739 Peripheral vascular disease, unspecified: Secondary | ICD-10-CM

## 2010-06-27 DIAGNOSIS — M629 Disorder of muscle, unspecified: Secondary | ICD-10-CM

## 2010-06-27 DIAGNOSIS — Z48812 Encounter for surgical aftercare following surgery on the circulatory system: Secondary | ICD-10-CM

## 2010-06-28 NOTE — Assessment & Plan Note (Signed)
OFFICE VISIT  Austin Diaz, Austin Diaz DOB:  1946/03/10                                       06/27/2010 EAVWU#:[id number]  The patient returns for initial followup regarding a right external iliac and common femoral endarterectomy with a right-to-left femoral- femoral bypass and left femoral-popliteal bypass using saphenous vein graft.  He was having ischemic symptoms in his left foot secondary to a combination of aortoiliac fem-pop occlusive disease.  He has done very well with complete resolution of claudication symptoms in the left leg. He is ambulating long distances.  He is having some swelling in the left leg which seems to be improving with elevation.  He denies any hemispheric or non-hemispheric TIAs, amaurosis fugax, diplopia, blurred vision or syncope.  He has had remote bilateral carotid endarterectomies.  PHYSICAL EXAM:  Vital signs:  Today blood pressure is 133/64, heart rate 100, respirations 20.  His inguinal incisions are healing nicely.  A small piece of suture was excised from the apex of the right inguinal wound.  There is no evidence of any infection or drainage.  He has 3+ femoral-femoral graft pulse and 2+ popliteal vein graft pulse on the left.  Both feet are well-perfused with 1+ edema on the left.  ABIs today are 0.73 on the left and 0.58 on the right.  I have reassured him regarding these findings.  We will continue to follow him on a regular basis in the lab with return in 3 months for a duplex scan of his bypass, ABIs and follow-up carotid duplex exam to check his carotid endarterectomy sites which were done many years ago and have not been rechecked.    Quita Skye [name], M.D. Electronically Signed  JDL/MEDQ  D:  06/27/2010  T:  06/28/2010  Job:  [id number]

## 2010-08-04 ENCOUNTER — Telehealth: Payer: Self-pay | Admitting: Cardiovascular Disease

## 2010-08-04 NOTE — Telephone Encounter (Signed)
[facility] that [facility] since his most recent was 01/2009 with his history of CAD (CABG 2010). He is no longer seeing Dr. Andee Lineman in [place] and has an appointment to f/u with Dr. Clifton James in May. He is wondering if he could have the echo in our office so he doesn't have to drive to [place]. I will forward to Dr. Clifton James to review.

## 2010-08-04 NOTE — Telephone Encounter (Signed)
Pt made fu appt with [facility], requesting an echo, he has to do one this year for the [place] and id he doesn't do it here he has to go to  and we are closer [phone]

## 2010-08-07 NOTE — Telephone Encounter (Signed)
Scheduled for 08/16/10 @ 1:00 pm.

## 2010-08-07 NOTE — Telephone Encounter (Signed)
We can arrange an echo here in our office. Thanks, cdm

## 2010-08-10 LAB — GLUCOSE, CAPILLARY
Glucose-Capillary: 123 mg/dL — ABNORMAL HIGH (ref 70–99)
Glucose-Capillary: 124 mg/dL — ABNORMAL HIGH (ref 70–99)
Glucose-Capillary: 126 mg/dL — ABNORMAL HIGH (ref 70–99)
Glucose-Capillary: 83 mg/dL (ref 70–99)
Glucose-Capillary: 87 mg/dL (ref 70–99)
Glucose-Capillary: 92 mg/dL (ref 70–99)
Glucose-Capillary: 96 mg/dL (ref 70–99)
Glucose-Capillary: 97 mg/dL (ref 70–99)

## 2010-08-10 LAB — MAGNESIUM
Magnesium: 2.3 mg/dL (ref 1.5–2.5)
Magnesium: 2.4 mg/dL (ref 1.5–2.5)

## 2010-08-10 LAB — WOUND CULTURE
Culture: NO GROWTH
Gram Stain: NONE SEEN

## 2010-08-10 LAB — CBC
HCT: 26.8 % — ABNORMAL LOW (ref 39.0–52.0)
HCT: 29.2 % — ABNORMAL LOW (ref 39.0–52.0)
Hemoglobin: 8.8 g/dL — ABNORMAL LOW (ref 13.0–17.0)
Hemoglobin: 9.5 g/dL — ABNORMAL LOW (ref 13.0–17.0)
MCHC: 32.6 g/dL (ref 30.0–36.0)
MCHC: 32.7 g/dL (ref 30.0–36.0)
MCHC: 33 g/dL (ref 30.0–36.0)
MCV: 85.2 fL (ref 78.0–100.0)
MCV: 85.9 fL (ref 78.0–100.0)
Platelets: 163 10*3/uL (ref 150–400)
Platelets: 173 10*3/uL (ref 150–400)
Platelets: 178 10*3/uL (ref 150–400)
RBC: 3.04 MIL/uL — ABNORMAL LOW (ref 4.22–5.81)
RBC: 3.05 MIL/uL — ABNORMAL LOW (ref 4.22–5.81)
RBC: 3.14 MIL/uL — ABNORMAL LOW (ref 4.22–5.81)
RBC: 3.4 MIL/uL — ABNORMAL LOW (ref 4.22–5.81)
RDW: 22.6 % — ABNORMAL HIGH (ref 11.5–15.5)
WBC: 12.9 10*3/uL — ABNORMAL HIGH (ref 4.0–10.5)
WBC: 5.2 10*3/uL (ref 4.0–10.5)
WBC: 8.3 10*3/uL (ref 4.0–10.5)

## 2010-08-10 LAB — BASIC METABOLIC PANEL
BUN: 6 mg/dL (ref 6–23)
CO2: 24 mEq/L (ref 19–32)
Calcium: 7.5 mg/dL — ABNORMAL LOW (ref 8.4–10.5)
Calcium: 8.2 mg/dL — ABNORMAL LOW (ref 8.4–10.5)
Creatinine, Ser: 1.04 mg/dL (ref 0.4–1.5)
Creatinine, Ser: 1.24 mg/dL (ref 0.4–1.5)
GFR calc Af Amer: >60 mL/min (ref >60)
GFR calc Af Amer: >60 mL/min (ref >60)

## 2010-08-10 LAB — CREATININE, SERUM: GFR calc non Af Amer: 55 mL/min — ABNORMAL LOW (ref >60)

## 2010-08-11 LAB — BASIC METABOLIC PANEL
BUN: 10 mg/dL (ref 6–23)
BUN: 13 mg/dL (ref 6–23)
BUN: 14 mg/dL (ref 6–23)
BUN: 15 mg/dL (ref 6–23)
BUN: 23 mg/dL (ref 6–23)
BUN: 6 mg/dL (ref 6–23)
CO2: 25 mEq/L (ref 19–32)
CO2: 26 mEq/L (ref 19–32)
CO2: 27 mEq/L (ref 19–32)
CO2: 35 mEq/L — ABNORMAL HIGH (ref 19–32)
CO2: 37 mEq/L — ABNORMAL HIGH (ref 19–32)
Calcium: 7.9 mg/dL — ABNORMAL LOW (ref 8.4–10.5)
Calcium: 8.1 mg/dL — ABNORMAL LOW (ref 8.4–10.5)
Calcium: 8.1 mg/dL — ABNORMAL LOW (ref 8.4–10.5)
Chloride: 104 mEq/L (ref 96–112)
Chloride: 104 mEq/L (ref 96–112)
Chloride: 105 mEq/L (ref 96–112)
Chloride: 107 mEq/L (ref 96–112)
Chloride: 109 mEq/L (ref 96–112)
Chloride: 111 mEq/L (ref 96–112)
Creatinine, Ser: 0.85 mg/dL (ref 0.4–1.5)
Creatinine, Ser: 0.97 mg/dL (ref 0.4–1.5)
Creatinine, Ser: 1.01 mg/dL (ref 0.4–1.5)
Creatinine, Ser: 1.04 mg/dL (ref 0.4–1.5)
Creatinine, Ser: 1.14 mg/dL (ref 0.4–1.5)
GFR calc Af Amer: >60 mL/min (ref >60)
GFR calc Af Amer: >60 mL/min (ref >60)
GFR calc non Af Amer: >60 mL/min (ref >60)
GFR calc non Af Amer: >60 mL/min (ref >60)
GFR calc non Af Amer: >60 mL/min (ref >60)
GFR calc non Af Amer: >60 mL/min (ref >60)
GFR calc non Af Amer: >60 mL/min (ref >60)
GFR calc non Af Amer: >60 mL/min (ref >60)
Glucose, Bld: 101 mg/dL — ABNORMAL HIGH (ref 70–99)
Glucose, Bld: 107 mg/dL — ABNORMAL HIGH (ref 70–99)
Glucose, Bld: 116 mg/dL — ABNORMAL HIGH (ref 70–99)
Glucose, Bld: 116 mg/dL — ABNORMAL HIGH (ref 70–99)
Glucose, Bld: 142 mg/dL — ABNORMAL HIGH (ref 70–99)
Glucose, Bld: 98 mg/dL (ref 70–99)
Potassium: 2.7 mEq/L — CL (ref 3.5–5.1)
Potassium: 3.3 mEq/L — ABNORMAL LOW (ref 3.5–5.1)
Potassium: 3.6 mEq/L (ref 3.5–5.1)
Potassium: 3.6 mEq/L (ref 3.5–5.1)
Potassium: 3.7 mEq/L (ref 3.5–5.1)
Potassium: 4 mEq/L (ref 3.5–5.1)
Sodium: 134 mEq/L — ABNORMAL LOW (ref 135–145)
Sodium: 136 mEq/L (ref 135–145)
Sodium: 139 mEq/L (ref 135–145)
Sodium: 140 mEq/L (ref 135–145)

## 2010-08-11 LAB — CBC
HCT: 23.7 % — ABNORMAL LOW (ref 39.0–52.0)
HCT: 25.9 % — ABNORMAL LOW (ref 39.0–52.0)
HCT: 26.8 % — ABNORMAL LOW (ref 39.0–52.0)
HCT: 27 % — ABNORMAL LOW (ref 39.0–52.0)
HCT: 27 % — ABNORMAL LOW (ref 39.0–52.0)
HCT: 27.3 % — ABNORMAL LOW (ref 39.0–52.0)
HCT: 29.5 % — ABNORMAL LOW (ref 39.0–52.0)
HCT: 29.7 % — ABNORMAL LOW (ref 39.0–52.0)
HCT: 29.7 % — ABNORMAL LOW (ref 39.0–52.0)
HCT: 30.4 % — ABNORMAL LOW (ref 39.0–52.0)
HCT: 30.5 % — ABNORMAL LOW (ref 39.0–52.0)
Hemoglobin: 10.1 g/dL — ABNORMAL LOW (ref 13.0–17.0)
Hemoglobin: 8.6 g/dL — ABNORMAL LOW (ref 13.0–17.0)
Hemoglobin: 8.8 g/dL — ABNORMAL LOW (ref 13.0–17.0)
Hemoglobin: 8.9 g/dL — ABNORMAL LOW (ref 13.0–17.0)
Hemoglobin: 9.3 g/dL — ABNORMAL LOW (ref 13.0–17.0)
Hemoglobin: 9.6 g/dL — ABNORMAL LOW (ref 13.0–17.0)
Hemoglobin: 9.7 g/dL — ABNORMAL LOW (ref 13.0–17.0)
MCHC: 31.2 g/dL (ref 30.0–36.0)
MCHC: 31.5 g/dL (ref 30.0–36.0)
MCHC: 31.5 g/dL (ref 30.0–36.0)
MCHC: 31.7 g/dL (ref 30.0–36.0)
MCHC: 31.8 g/dL (ref 30.0–36.0)
MCHC: 32.1 g/dL (ref 30.0–36.0)
MCHC: 32.6 g/dL (ref 30.0–36.0)
MCHC: 33 g/dL (ref 30.0–36.0)
MCV: 78.5 fL (ref 78.0–100.0)
MCV: 79.8 fL (ref 78.0–100.0)
MCV: 80 fL (ref 78.0–100.0)
MCV: 80.3 fL (ref 78.0–100.0)
MCV: 80.9 fL (ref 78.0–100.0)
MCV: 81.5 fL (ref 78.0–100.0)
MCV: 83.7 fL (ref 78.0–100.0)
MCV: 83.8 fL (ref 78.0–100.0)
MCV: 83.9 fL (ref 78.0–100.0)
MCV: 84.4 fL (ref 78.0–100.0)
Platelets: 168 10*3/uL (ref 150–400)
Platelets: 184 10*3/uL (ref 150–400)
Platelets: 184 10*3/uL (ref 150–400)
Platelets: 186 10*3/uL (ref 150–400)
Platelets: 187 10*3/uL (ref 150–400)
Platelets: 187 10*3/uL (ref 150–400)
Platelets: 190 10*3/uL (ref 150–400)
Platelets: 209 10*3/uL (ref 150–400)
Platelets: 246 10*3/uL (ref 150–400)
Platelets: 254 10*3/uL (ref 150–400)
Platelets: 332 10*3/uL (ref 150–400)
RBC: 3.03 MIL/uL — ABNORMAL LOW (ref 4.22–5.81)
RBC: 3.06 MIL/uL — ABNORMAL LOW (ref 4.22–5.81)
RBC: 3.21 MIL/uL — ABNORMAL LOW (ref 4.22–5.81)
RBC: 3.4 MIL/uL — ABNORMAL LOW (ref 4.22–5.81)
RBC: 3.91 MIL/uL — ABNORMAL LOW (ref 4.22–5.81)
RDW: 17.2 % — ABNORMAL HIGH (ref 11.5–15.5)
RDW: 17.5 % — ABNORMAL HIGH (ref 11.5–15.5)
RDW: 17.5 % — ABNORMAL HIGH (ref 11.5–15.5)
RDW: 17.5 % — ABNORMAL HIGH (ref 11.5–15.5)
RDW: 17.6 % — ABNORMAL HIGH (ref 11.5–15.5)
RDW: 18 % — ABNORMAL HIGH (ref 11.5–15.5)
RDW: 18.4 % — ABNORMAL HIGH (ref 11.5–15.5)
RDW: 18.4 % — ABNORMAL HIGH (ref 11.5–15.5)
RDW: 18.7 % — ABNORMAL HIGH (ref 11.5–15.5)
RDW: 18.8 % — ABNORMAL HIGH (ref 11.5–15.5)
RDW: 19.7 % — ABNORMAL HIGH (ref 11.5–15.5)
RDW: 20.8 % — ABNORMAL HIGH (ref 11.5–15.5)
RDW: 21 % — ABNORMAL HIGH (ref 11.5–15.5)
WBC: 10.7 10*3/uL — ABNORMAL HIGH (ref 4.0–10.5)
WBC: 13.6 10*3/uL — ABNORMAL HIGH (ref 4.0–10.5)
WBC: 16.3 10*3/uL — ABNORMAL HIGH (ref 4.0–10.5)
WBC: 6.8 10*3/uL (ref 4.0–10.5)
WBC: 6.9 10*3/uL (ref 4.0–10.5)
WBC: 7.4 10*3/uL (ref 4.0–10.5)
WBC: 8 10*3/uL (ref 4.0–10.5)
WBC: 9 10*3/uL (ref 4.0–10.5)
WBC: 9 10*3/uL (ref 4.0–10.5)
WBC: 9.2 10*3/uL (ref 4.0–10.5)
WBC: 9.6 10*3/uL (ref 4.0–10.5)

## 2010-08-11 LAB — HEPATITIS PANEL, ACUTE
Hep A IgM: NEGATIVE
Hep B C IgM: NEGATIVE
Hepatitis B Surface Ag: NEGATIVE

## 2010-08-11 LAB — GLUCOSE, CAPILLARY
Glucose-Capillary: 105 mg/dL — ABNORMAL HIGH (ref 70–99)
Glucose-Capillary: 105 mg/dL — ABNORMAL HIGH (ref 70–99)
Glucose-Capillary: 105 mg/dL — ABNORMAL HIGH (ref 70–99)
Glucose-Capillary: 106 mg/dL — ABNORMAL HIGH (ref 70–99)
Glucose-Capillary: 112 mg/dL — ABNORMAL HIGH (ref 70–99)
Glucose-Capillary: 116 mg/dL — ABNORMAL HIGH (ref 70–99)
Glucose-Capillary: 122 mg/dL — ABNORMAL HIGH (ref 70–99)
Glucose-Capillary: 122 mg/dL — ABNORMAL HIGH (ref 70–99)
Glucose-Capillary: 124 mg/dL — ABNORMAL HIGH (ref 70–99)
Glucose-Capillary: 135 mg/dL — ABNORMAL HIGH (ref 70–99)
Glucose-Capillary: 137 mg/dL — ABNORMAL HIGH (ref 70–99)
Glucose-Capillary: 143 mg/dL — ABNORMAL HIGH (ref 70–99)
Glucose-Capillary: 143 mg/dL — ABNORMAL HIGH (ref 70–99)
Glucose-Capillary: 149 mg/dL — ABNORMAL HIGH (ref 70–99)
Glucose-Capillary: 156 mg/dL — ABNORMAL HIGH (ref 70–99)
Glucose-Capillary: 160 mg/dL — ABNORMAL HIGH (ref 70–99)
Glucose-Capillary: 170 mg/dL — ABNORMAL HIGH (ref 70–99)
Glucose-Capillary: 180 mg/dL — ABNORMAL HIGH (ref 70–99)
Glucose-Capillary: 92 mg/dL (ref 70–99)
Glucose-Capillary: 92 mg/dL (ref 70–99)
Glucose-Capillary: 94 mg/dL (ref 70–99)
Glucose-Capillary: 95 mg/dL (ref 70–99)
Glucose-Capillary: 96 mg/dL (ref 70–99)

## 2010-08-11 LAB — COMPREHENSIVE METABOLIC PANEL
AST: 110 U/L — ABNORMAL HIGH (ref 0–37)
AST: 978 U/L — ABNORMAL HIGH (ref 0–37)
Albumin: 2.3 g/dL — ABNORMAL LOW (ref 3.5–5.2)
Albumin: 2.4 g/dL — ABNORMAL LOW (ref 3.5–5.2)
Albumin: 2.6 g/dL — ABNORMAL LOW (ref 3.5–5.2)
Alkaline Phosphatase: 46 U/L (ref 39–117)
Alkaline Phosphatase: 49 U/L (ref 39–117)
Alkaline Phosphatase: 51 U/L (ref 39–117)
Alkaline Phosphatase: 72 U/L (ref 39–117)
BUN: 11 mg/dL (ref 6–23)
BUN: 14 mg/dL (ref 6–23)
BUN: 15 mg/dL (ref 6–23)
BUN: 15 mg/dL (ref 6–23)
BUN: 16 mg/dL (ref 6–23)
BUN: 28 mg/dL — ABNORMAL HIGH (ref 6–23)
BUN: 6 mg/dL (ref 6–23)
CO2: 23 mEq/L (ref 19–32)
CO2: 35 mEq/L — ABNORMAL HIGH (ref 19–32)
Calcium: 7.6 mg/dL — ABNORMAL LOW (ref 8.4–10.5)
Calcium: 7.8 mg/dL — ABNORMAL LOW (ref 8.4–10.5)
Chloride: 100 mEq/L (ref 96–112)
Chloride: 101 mEq/L (ref 96–112)
Chloride: 105 mEq/L (ref 96–112)
Chloride: 107 mEq/L (ref 96–112)
Creatinine, Ser: 1.13 mg/dL (ref 0.4–1.5)
Creatinine, Ser: 1.16 mg/dL (ref 0.4–1.5)
Creatinine, Ser: 1.19 mg/dL (ref 0.4–1.5)
Creatinine, Ser: 1.26 mg/dL (ref 0.4–1.5)
GFR calc Af Amer: >60 mL/min (ref >60)
GFR calc Af Amer: >60 mL/min (ref >60)
GFR calc non Af Amer: 54 mL/min — ABNORMAL LOW (ref >60)
GFR calc non Af Amer: >60 mL/min (ref >60)
GFR calc non Af Amer: >60 mL/min (ref >60)
Glucose, Bld: 133 mg/dL — ABNORMAL HIGH (ref 70–99)
Glucose, Bld: 140 mg/dL — ABNORMAL HIGH (ref 70–99)
Glucose, Bld: 158 mg/dL — ABNORMAL HIGH (ref 70–99)
Potassium: 2.6 mEq/L — CL (ref 3.5–5.1)
Potassium: 3 mEq/L — ABNORMAL LOW (ref 3.5–5.1)
Potassium: 3.4 mEq/L — ABNORMAL LOW (ref 3.5–5.1)
Potassium: 4.5 mEq/L (ref 3.5–5.1)
Total Bilirubin: 0.5 mg/dL (ref 0.3–1.2)
Total Bilirubin: 0.6 mg/dL (ref 0.3–1.2)
Total Bilirubin: 0.7 mg/dL (ref 0.3–1.2)
Total Bilirubin: 0.7 mg/dL (ref 0.3–1.2)
Total Bilirubin: 0.8 mg/dL (ref 0.3–1.2)
Total Bilirubin: 1 mg/dL (ref 0.3–1.2)
Total Protein: 4.9 g/dL — ABNORMAL LOW (ref 6.0–8.3)
Total Protein: 5.1 g/dL — ABNORMAL LOW (ref 6.0–8.3)
Total Protein: 5.4 g/dL — ABNORMAL LOW (ref 6.0–8.3)
Total Protein: 5.9 g/dL — ABNORMAL LOW (ref 6.0–8.3)
Total Protein: 6.5 g/dL (ref 6.0–8.3)

## 2010-08-11 LAB — TYPE AND SCREEN: ABO/RH(D): A POS

## 2010-08-11 LAB — BLOOD GAS, ARTERIAL
Acid-Base Excess: 1.4 mmol/L (ref 0.0–2.0)
Acid-Base Excess: 4.2 mmol/L — ABNORMAL HIGH (ref 0.0–2.0)
Acid-Base Excess: 9.8 mmol/L — ABNORMAL HIGH (ref 0.0–2.0)
Acid-base deficit: 0.3 mmol/L (ref 0.0–2.0)
Acid-base deficit: 5.3 mmol/L — ABNORMAL HIGH (ref 0.0–2.0)
Acid-base deficit: 6.2 mmol/L — ABNORMAL HIGH (ref 0.0–2.0)
Bicarbonate: 23.7 mEq/L (ref 20.0–24.0)
Bicarbonate: 25.4 mEq/L — ABNORMAL HIGH (ref 20.0–24.0)
Bicarbonate: 28 mEq/L — ABNORMAL HIGH (ref 20.0–24.0)
Drawn by: 270271
FIO2: 0.21 %
FIO2: 0.4 %
FIO2: 0.4 %
FIO2: 1 %
FIO2: 1 %
MECHVT: 600 mL
MECHVT: 650 mL
O2 Saturation: 96.4 %
O2 Saturation: 97 %
O2 Saturation: 97.2 %
O2 Saturation: 97.7 %
O2 Saturation: 99 %
PEEP: 5 cmH2O
PEEP: 5 cmH2O
PEEP: 5 cmH2O
Patient temperature: 98.2
Patient temperature: 98.6
Patient temperature: 98.6
Patient temperature: 98.6
RATE: 14 resp/min
RATE: 14 resp/min
RATE: 14 resp/min
RATE: 16 resp/min
TCO2: 21.9 mmol/L (ref 0–100)
TCO2: 26.7 mmol/L (ref 0–100)
TCO2: 23 mmol/L (ref 0–100)
pCO2 arterial: 33.8 mmHg — ABNORMAL LOW (ref 35.0–45.0)
pCO2 arterial: 60.4 mmHg (ref 35.0–45.0)
pH, Arterial: 7.354 (ref 7.350–7.450)
pH, Arterial: 7.417 (ref 7.350–7.450)
pO2, Arterial: 99 mmHg (ref 80.0–100.0)
pO2, Arterial: 79.6 mmHg — ABNORMAL LOW (ref 80.0–100.0)

## 2010-08-11 LAB — LIPID PANEL
Cholesterol: 114 mg/dL (ref 0–200)
Cholesterol: 73 mg/dL (ref 0–200)
LDL Cholesterol: 28 mg/dL (ref 0–99)
LDL Cholesterol: 78 mg/dL (ref 0–99)
Total CHOL/HDL Ratio: 3.7 RATIO
Triglycerides: 27 mg/dL (ref <150)
Triglycerides: 58 mg/dL (ref <150)
VLDL: 5 mg/dL (ref 0–40)

## 2010-08-11 LAB — URINALYSIS, ROUTINE W REFLEX MICROSCOPIC
Bilirubin Urine: NEGATIVE
Bilirubin Urine: NEGATIVE
Glucose, UA: NEGATIVE mg/dL
Glucose, UA: NEGATIVE mg/dL
Ketones, ur: NEGATIVE mg/dL
Ketones, ur: NEGATIVE mg/dL
Leukocytes, UA: NEGATIVE
pH: 6 (ref 5.0–8.0)
pH: 6 (ref 5.0–8.0)

## 2010-08-11 LAB — PHOSPHORUS
Phosphorus: 1.6 mg/dL — ABNORMAL LOW (ref 2.3–4.6)
Phosphorus: 2.5 mg/dL (ref 2.3–4.6)

## 2010-08-11 LAB — HEPARIN LEVEL (UNFRACTIONATED)
Heparin Unfractionated: 0.18 IU/mL — ABNORMAL LOW (ref 0.30–0.70)
Heparin Unfractionated: 0.27 IU/mL — ABNORMAL LOW (ref 0.30–0.70)
Heparin Unfractionated: 0.38 IU/mL (ref 0.30–0.70)
Heparin Unfractionated: 0.56 IU/mL (ref 0.30–0.70)
Heparin Unfractionated: 0.64 IU/mL (ref 0.30–0.70)
Heparin Unfractionated: <0.1 IU/mL — ABNORMAL LOW (ref 0.30–0.70)

## 2010-08-11 LAB — DIFFERENTIAL
Basophils Absolute: 0 10*3/uL (ref 0.0–0.1)
Basophils Relative: 0 % (ref 0–1)
Basophils Relative: 0 % (ref 0–1)
Eosinophils Absolute: 0 10*3/uL (ref 0.0–0.7)
Eosinophils Absolute: 0 10*3/uL (ref 0.0–0.7)
Eosinophils Relative: 0 % (ref 0–5)
Lymphocytes Relative: 3 % — ABNORMAL LOW (ref 12–46)
Lymphs Abs: 0.5 10*3/uL — ABNORMAL LOW (ref 0.7–4.0)
Monocytes Absolute: 0.6 10*3/uL (ref 0.1–1.0)
Monocytes Relative: 3 % (ref 3–12)
Monocytes Relative: 5 % (ref 3–12)
Neutro Abs: 15.3 10*3/uL — ABNORMAL HIGH (ref 1.7–7.7)
Neutro Abs: 9.5 10*3/uL — ABNORMAL HIGH (ref 1.7–7.7)
Neutrophils Relative %: 84 % — ABNORMAL HIGH (ref 43–77)
Neutrophils Relative %: 87 % — ABNORMAL HIGH (ref 43–77)
Neutrophils Relative %: 94 % — ABNORMAL HIGH (ref 43–77)

## 2010-08-11 LAB — CROSSMATCH

## 2010-08-11 LAB — POCT I-STAT 3, ART BLOOD GAS (G3+)
Bicarbonate: 24.2 mEq/L — ABNORMAL HIGH (ref 20.0–24.0)
Bicarbonate: 24.4 mEq/L — ABNORMAL HIGH (ref 20.0–24.0)
Bicarbonate: 24.4 mEq/L — ABNORMAL HIGH (ref 20.0–24.0)
O2 Saturation: 100 %
O2 Saturation: 94 %
Patient temperature: 36.3
TCO2: 25 mmol/L (ref 0–100)
TCO2: 25 mmol/L (ref 0–100)
TCO2: 26 mmol/L (ref 0–100)
TCO2: 26 mmol/L (ref 0–100)
pCO2 arterial: 38.2 mmHg (ref 35.0–45.0)
pCO2 arterial: 38.8 mmHg (ref 35.0–45.0)
pH, Arterial: 7.388 (ref 7.350–7.450)
pH, Arterial: 7.401 (ref 7.350–7.450)
pH, Arterial: 7.415 (ref 7.350–7.450)
pH, Arterial: 7.467 — ABNORMAL HIGH (ref 7.350–7.450)
pO2, Arterial: 167 mmHg — ABNORMAL HIGH (ref 80.0–100.0)
pO2, Arterial: 306 mmHg — ABNORMAL HIGH (ref 80.0–100.0)

## 2010-08-11 LAB — PROTIME-INR
INR: 1.1 (ref 0.00–1.49)
INR: 1.3 (ref 0.00–1.49)
INR: 1.4 (ref 0.00–1.49)
Prothrombin Time: 14.7 seconds (ref 11.6–15.2)
Prothrombin Time: 17.2 seconds — ABNORMAL HIGH (ref 11.6–15.2)

## 2010-08-11 LAB — URINE MICROSCOPIC-ADD ON

## 2010-08-11 LAB — POCT I-STAT 4, (NA,K, GLUC, HGB,HCT)
Glucose, Bld: 113 mg/dL — ABNORMAL HIGH (ref 70–99)
Glucose, Bld: 94 mg/dL (ref 70–99)
HCT: 19 % — ABNORMAL LOW (ref 39.0–52.0)
HCT: 28 % — ABNORMAL LOW (ref 39.0–52.0)
Hemoglobin: 9.5 g/dL — ABNORMAL LOW (ref 13.0–17.0)
Hemoglobin: 9.9 g/dL — ABNORMAL LOW (ref 13.0–17.0)
Potassium: 3.6 mEq/L (ref 3.5–5.1)
Potassium: 3.7 mEq/L (ref 3.5–5.1)
Sodium: 138 mEq/L (ref 135–145)
Sodium: 140 mEq/L (ref 135–145)

## 2010-08-11 LAB — CULTURE, BLOOD (ROUTINE X 2): Culture: NO GROWTH

## 2010-08-11 LAB — MAGNESIUM
Magnesium: 1.8 mg/dL (ref 1.5–2.5)
Magnesium: 2 mg/dL (ref 1.5–2.5)
Magnesium: 2.2 mg/dL (ref 1.5–2.5)
Magnesium: 2.8 mg/dL — ABNORMAL HIGH (ref 1.5–2.5)

## 2010-08-11 LAB — URINE CULTURE

## 2010-08-11 LAB — HEMOGLOBIN AND HEMATOCRIT, BLOOD
HCT: 19.4 % — ABNORMAL LOW (ref 39.0–52.0)
Hemoglobin: 6.4 g/dL — CL (ref 13.0–17.0)

## 2010-08-11 LAB — CARDIAC PANEL(CRET KIN+CKTOT+MB+TROPI)
CK, MB: 16.6 ng/mL — ABNORMAL HIGH (ref 0.3–4.0)
CK, MB: 25 ng/mL — ABNORMAL HIGH (ref 0.3–4.0)
CK, MB: 39.7 ng/mL — ABNORMAL HIGH (ref 0.3–4.0)
CK, MB: 98.3 ng/mL — ABNORMAL HIGH (ref 0.3–4.0)
Total CK: 1115 U/L — ABNORMAL HIGH (ref 7–232)
Total CK: 763 U/L — ABNORMAL HIGH (ref 7–232)
Total CK: 937 U/L — ABNORMAL HIGH (ref 7–232)
Troponin I: 8.8 ng/mL (ref 0.00–0.06)

## 2010-08-11 LAB — POCT I-STAT 3, VENOUS BLOOD GAS (G3P V)
Acid-base deficit: 1 mmol/L (ref 0.0–2.0)
Bicarbonate: 23.6 mEq/L (ref 20.0–24.0)
Patient temperature: 34.5

## 2010-08-11 LAB — POCT I-STAT, CHEM 8
Hemoglobin: 9.5 g/dL — ABNORMAL LOW (ref 13.0–17.0)
Sodium: 137 mEq/L (ref 135–145)
TCO2: 22 mmol/L (ref 0–100)

## 2010-08-11 LAB — CULTURE, BAL-QUANTITATIVE W GRAM STAIN: Culture: NO GROWTH

## 2010-08-11 LAB — PREPARE PLATELETS

## 2010-08-11 LAB — IRON: Iron: 15 ug/dL — ABNORMAL LOW (ref 42–135)

## 2010-08-11 LAB — APTT
aPTT: 29 seconds (ref 24–37)
aPTT: 37 seconds (ref 24–37)

## 2010-08-11 LAB — CREATININE, SERUM
Creatinine, Ser: 1.11 mg/dL (ref 0.4–1.5)
GFR calc Af Amer: >60 mL/min (ref >60)

## 2010-08-11 LAB — LEGIONELLA ANTIGEN, URINE: Legionella Antigen, Urine: NEGATIVE

## 2010-08-11 LAB — STREP PNEUMONIAE URINARY ANTIGEN: Strep Pneumo Urinary Antigen: NEGATIVE

## 2010-08-11 LAB — PREPARE RBC (CROSSMATCH)

## 2010-08-11 LAB — HEMOCCULT GUIAC POC 1CARD (OFFICE)
Fecal Occult Bld: NEGATIVE
Fecal Occult Bld: POSITIVE

## 2010-08-11 LAB — FERRITIN: Ferritin: 40 ng/mL (ref 22–322)

## 2010-08-11 LAB — PLATELET COUNT: Platelets: 123 10*3/uL — ABNORMAL LOW (ref 150–400)

## 2010-08-11 LAB — MRSA PCR SCREENING: MRSA by PCR: NEGATIVE

## 2010-08-11 LAB — HEMOGLOBIN A1C: Hgb A1c MFr Bld: 5.5 % (ref 4.6–6.1)

## 2010-08-11 LAB — TSH: TSH: 1.209 u[IU]/mL (ref 0.350–4.500)

## 2010-08-15 ENCOUNTER — Other Ambulatory Visit (HOSPITAL_COMMUNITY): Payer: Self-pay | Admitting: Radiology

## 2010-08-15 DIAGNOSIS — I2510 Atherosclerotic heart disease of native coronary artery without angina pectoris: Secondary | ICD-10-CM

## 2010-08-16 ENCOUNTER — Ambulatory Visit (HOSPITAL_COMMUNITY): Payer: Medicare Other | Attending: Cardiovascular Disease | Admitting: Radiology

## 2010-08-16 DIAGNOSIS — E785 Hyperlipidemia, unspecified: Secondary | ICD-10-CM | POA: Insufficient documentation

## 2010-08-16 DIAGNOSIS — I079 Rheumatic tricuspid valve disease, unspecified: Secondary | ICD-10-CM | POA: Insufficient documentation

## 2010-08-16 DIAGNOSIS — I10 Essential (primary) hypertension: Secondary | ICD-10-CM | POA: Insufficient documentation

## 2010-08-16 DIAGNOSIS — I2510 Atherosclerotic heart disease of native coronary artery without angina pectoris: Secondary | ICD-10-CM

## 2010-08-16 HISTORY — PX: TRANSTHORACIC ECHOCARDIOGRAM: SHX275

## 2010-08-21 NOTE — Progress Notes (Signed)
lvmtcb-  I will attach this to the echo report.

## 2010-08-21 NOTE — Progress Notes (Signed)
Echo with normal LV function. Left atrium is enlarged but we will just follow this. Can we let him know? Thanks, chris  LVMTCB  [place], 6:09 PM

## 2010-08-21 NOTE — Progress Notes (Signed)
Echo with normal LV function. Left atrium is enlarged but we will just follow this. Can we let him know? Thanks, chris

## 2010-08-24 NOTE — Progress Notes (Signed)
Patient is aware of echo results.

## 2010-09-19 NOTE — Assessment & Plan Note (Signed)
OFFICE VISIT   Austin Diaz, Austin Diaz  DOB:  04/15/1946                                        March 07, 2009  CHART #:  [id number]   HISTORY:  The patient is a 65 year old white male smoker who has  recently been hospitalized and underwent coronary artery bypass grafting  x2 on February 03, 2009.  This was for a high-grade left main and two-  vessel coronary artery disease status post non-ST segment elevation  myocardial infarction.  He presents on today's date for routine follow  up in the surgeon's office.  Currently, he reports that he is feeling  better with time.  He is increasing his activities.  He is having no  fevers, chills, or other constitutional symptoms.  He does have some  mild discomfort.  Unfortunately, he also reports that he has resumed  smoking.  He does state that it is not as much as he did in the past.   DIAGNOSTIC TESTS:  Chest x-ray was obtained on March 01, 2009, which  reveals clear lung fields without evidence of congestive failure,  infiltrates, or significant effusions.   PHYSICAL EXAMINATION:  Vital Signs:  Blood pressure 138/72, pulse was  109, respirations 18, oxygen saturation is 98% on room air.  Note, the  patient recently had his Lopressor stopped by Dr. Clifton James.  He reports  in his diary of vital signs that his heart rate is usually in the 50-70  range.  General Appearance:  A well-developed adult male no acute  distress.  Pulmonary:  Clear lungs without rales, rhonchi, or wheeze.  Cardiac:  Regular rate and rhythm, tachycardiac.  Normal S1 and S2  without murmurs, gallops, or rubs.  Abdomen:  Soft, nontender.  Extremities:  Mild edema.  Incisions are healing well without evidence  of infection.   ASSESSMENT:  The patient is making adequate recovery following his  surgical revascularization.  I did discuss the perils of tobacco use,  and he primarily attributes his current use to boredom.  I discussed the  smoking  cessation program at [facility] and encouraged him to work on  hobbies, etc.  I also discussed with him current lifting restrictions  and return to driving.  He understands these and will follow through.  I  also encouraged other lifestyle changes  related to exercise and diet.  He will continue to follow up with Dr.  Clifton James for ongoing cardiology risk management.  We will see him again  on a p.r.n. basis.   Rowe Clack, P.A.-C.   Austin Diaz  D:  03/07/2009  T:  03/08/2009  Job:  [id number]   cc:   Verne Carrow, MD

## 2010-09-19 NOTE — Procedures (Signed)
NAME:  Austin Diaz, Austin Diaz NO.:  [id number]   MEDICAL RECORD NO.:  [id number]          PATIENT TYPE:  AMB   LOCATION:  SDS                          FACILITY:  [facility]   PHYSICIAN:  Verne Carrow, MDDATE OF BIRTH:  09-22-1945   DATE OF PROCEDURE:  05/17/2010  DATE OF DISCHARGE:  05/17/2010                    PERIPHERAL VASCULAR INVASIVE PROCEDURE    PRIMARY CARE PHYSICIAN:  Dr. Robynn Pane at [facility].   PRIMARY CARDIOLOGIST:  Learta Codding, MD, [facility]   PROCEDURE PERFORMED:  Distal aortogram with bilateral lower extremity  runoff.   OPERATOR:  Verne Carrow, MD   INDICATIONS:  This is a 65 year old Caucasian male with a history of  severe peripheral vascular disease including prior carotid  endarterectomies as well as coronary artery disease, status post two-  vessel CABG in September 2010 and ongoing tobacco abuse, who has  recently had complaints of pain in his left lower extremity with  ambulation consistent with claudication.  Noninvasive studies in June  2011 showed severe disease in the left leg with an ABI of 0.36.  The  patient's ABI was 0.70 on the right.  Because of the progression of his  symptoms, we elected to proceed to a diagnostic procedure today to  better define his disease.   DETAILS OF PROCEDURE:  The patient was brought to the main peripheral  vascular laboratory after signing informed consent for the procedure.  The right groin was prepped and draped in sterile fashion.  1% lidocaine  was used for local anesthesia.  A 5-French sheath was inserted into the  right femoral artery without difficulty.  A pigtail catheter was  advanced into the distal aorta.  First angiogram was performed with a  pigtail catheter just above the takeoff of the renal artery.  We then  pulled the catheter back to the aortic bifurcation and performed pelvic  angiography with several angulated views.  We then performed runoff  throughout  both lower extremities.  The patient tolerated the procedure  well, was taken to the holding area in stable condition.   HEMODYNAMIC FINDINGS:  Central aortic pressure 105/56.   ANGIOGRAPHIC FINDINGS:  1. The distal aorta has diffuse plaque and a distal 40% stenosis with      heavy calcification just prior to the aortic bifurcation.  2. 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  3. The left renal artery is patent with 30% proximal stenosis.  4. The left common iliac artery has a 100% occlusion in the ostium.      There is reconstitution in the left common femoral artery, but      there is severe disease throughout the left common femoral artery.      The left superficial femoral artery is occluded proximally and      reconstitutes in the distal portion from collaterals from the      patent profunda femoral artery.  The popliteal artery on the left      is patent.  The left posterior tibial artery is occluded.  There is      two-vessel runoff to the left foot via the anterior tibial artery  and peroneal artery.  5. The right common iliac artery has a proximal 60% stenosis.  There      is diffuse plaque throughout the right external iliac artery with      no focal lesions.  The right common femoral artery was calcified      with approximately 40% stenosis.  The right superficial femoral      artery had serial 60% lesions in one area of a focal 80% stenosis      in the proximal portion.  There is diffuse disease throughout this      entire vessel.  The right popliteal artery is patent.  There is      three-vessel runoff to the right foot.   IMPRESSION:  Severe peripheral arterial disease.   RECOMMENDATIONS:  This patient has not had any good percutaneous options  for revascularization.  I will make a referral to Vascular Surgery for  potential bypass surgery on his lower extremities.      Verne Carrow, MD      CM/MEDQ  D:  05/17/2010  T:  05/18/2010  Job:  [id number]   cc:    Dr. Roderic Ovens, MD,FACC  Gene Shara Blazing, PA-C   Electronically Signed by Verne Carrow MD on 05/18/2010 06:31:08 PM

## 2010-09-20 ENCOUNTER — Encounter: Payer: Self-pay | Admitting: Cardiovascular Disease

## 2010-09-22 NOTE — H&P (Signed)
[facility]  Patient:    Austin Diaz, Austin Diaz                      MRN: [id number] Adm. Date:  11/22/00 Attending:  Philips J. Montez Morita, M.D. Dictator:   Coleen P.A.C.                         History and Physical  DATE OF BIRTH:  07-May-1946  CHIEF COMPLAINT:  Left hip pain and dysfunction.  HISTORY OF PRESENT ILLNESS:  The patient has a long history of bilateral hip problems.  He had a left total hip replaced years ago, and has had two revisions according to him.  Since that point this will be his third.  He has continued pain, increasing pain and dysfunction, which is failing conservative management.  His activities of daily living are affected.  It is thought at this point, due to a loose acetabular component in the left hip, this needs to be revised.  The risks and benefits of the procedure were discussed with the patient by Dr. Ronnell Guadalajara, and they decided this was the most appropriate course of action, and the patient did want to proceed.  The patients primary care physician is Dr. Wende Crease at [facility].  PAST MEDICAL HISTORY: 1. Hypertension. 2. Benign heart murmur. 3. Nonfunctioning right kidney. 4. Two incisional hernias which have not been repaired, first one is midline    in the proximal part of his abdomen, and the second is on the distal    portion of his incision on the right side of his abdomen.  PAST SURGICAL HISTORY: 1. Carotid endarterectomy on the left side in 1995, on the right side in 1999. 2. Renal artery bypass on the right side in 1990. 3. Left total hip replacement and revision x 2. 4. Right total hip replacement and revision x 1. 5. Lumbar microdiskectomy.  SOCIAL HISTORY:  The patient does smoke 1-1/2 packs of cigarettes per day.  He does drink alcohol 4 to 6 beers per day.  He is divorced.  He lives alone.  He does have family who lives in the area that he will be staying with, or will stay with him  following surgery.  FAMILY HISTORY:  His mother is deceased in her 1s of massive stroke.  She also had osteoporosis.  Father is alive at age 74.  He is healthy other than hypertension.  ALLERGIES:  MORPHINE causes hallucinations.  MEDICATIONS: 1. Lotensin 40 mg one p.o. b.i.d. 2. Cardura 8 mg one p.o. q.d. 3. Effexor 37.5 mg two q.d. 4. Aspirin two q.d.  Aspirin was stopped 1-1/2 weeks ago.  REVIEW OF SYSTEMS:  The patient denies fevers, chills, night sweats, or bleeding tendencies.  Denies blurred vision, double vision, seizures, headaches, or paralysis.  Denies shortness of breath, productive cough, hemoptysis, denies chest pain, angina, claudication, or orthopnea.  Denies nausea, vomiting, constipation, diarrhea, melena, or bloody stool.  Denies dysuria, hematuria, discharge.  Denies any paresthesias or numbness or weakness in his extremities.  Please see HPI for other problems with his left hip.  PHYSICAL EXAMINATION:  VITAL SIGNS:  Blood pressure 128/80, respirations 14 and unlabored, pulse is 88 and regular.  GENERAL:  He is a 65 year old white male.  He is in no acute distress.  He is alert, oriented, pleasant, and cooperative to examination.  HEENT:  Normocephalic, atraumatic head.  Pupils are equal, round and reactive  to light.  NECK:  He has bilateral scars secondary to his carotid endarterectomies.  LUNGS:  Auscultation revealed bruit on the right side, and no bruit on the left side.  The patient revealed that left side is 100% blocked.  CHEST:  Clear to auscultation bilaterally, anterior and posterior.  BREASTS:  Not pertinent and not examined.  HEART:  Regular S1 and S2, regular rate and rhythm, no murmurs, rubs, or gallops appreciated.  ABDOMEN:  Positive bowel sounds throughout.  He has a midline scar.  Obese abdomen.  He has two hernias as already mentioned which are easily reducible and not tender.  No organomegaly, no tenderness to palpation of the  abdomen.  GASTROURINARY:  Not pertinent and not examined.  EXTREMITIES:  Neurovascularly intact bilateral lower extremities.  Hip range of motion is limited secondary to pain.  Sensation and motor function are intact in bilateral lower extremities.  SKIN:  Intact without any rashes or lesions.  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  PLAN:  Admit to [facility] on November 22, 2000, for a left total hip acetabular component revision to be done by Dr. Ronnell Guadalajara.  The patient has recently seen his general medical doctor, who is Dr. Wende Crease out of [facility], and he did ask his physician to fax over a medical clearance to [place].  This is still pending at this time.  DD:  11/14/00 TD:  11/14/00 Job: [id number]

## 2010-09-25 ENCOUNTER — Other Ambulatory Visit: Payer: Medicare Other

## 2010-09-26 ENCOUNTER — Ambulatory Visit: Payer: Medicare Other | Admitting: Cardiovascular Disease

## 2010-11-30 ENCOUNTER — Encounter (INDEPENDENT_AMBULATORY_CARE_PROVIDER_SITE_OTHER): Payer: Medicare Other

## 2010-11-30 ENCOUNTER — Other Ambulatory Visit (INDEPENDENT_AMBULATORY_CARE_PROVIDER_SITE_OTHER): Payer: Medicare Other

## 2010-11-30 DIAGNOSIS — Z48812 Encounter for surgical aftercare following surgery on the circulatory system: Secondary | ICD-10-CM

## 2010-11-30 DIAGNOSIS — I739 Peripheral vascular disease, unspecified: Secondary | ICD-10-CM

## 2010-11-30 DIAGNOSIS — I70219 Atherosclerosis of native arteries of extremities with intermittent claudication, unspecified extremity: Secondary | ICD-10-CM

## 2010-11-30 DIAGNOSIS — I6529 Occlusion and stenosis of unspecified carotid artery: Secondary | ICD-10-CM

## 2010-12-06 NOTE — Procedures (Unsigned)
BYPASS GRAFT EVALUATION  INDICATION:  Followup peripheral vascular disease.  HISTORY: Diabetes:  Yes Cardiac:  CAD, CABG and MI September 2010 Hypertension:  Yes Smoking:  Currently Previous Surgery:  Right femoral to left femoral bypass graft; left femoral to popliteal bypass graft; right external iliac artery/common femoral artery endarterectomy, all done on 05/31/2010.  SINGLE LEVEL ARTERIAL EXAM                              RIGHT              LEFT Brachial:                    75                 145 Anterior tibial:             65                 94 Posterior tibial:            67                 98 Peroneal: Ankle/brachial index:        0.46               0.68  PREVIOUS ABI:  Date: 06/27/2010  RIGHT:  0.58  LEFT:  0.73  LOWER EXTREMITY BYPASS GRAFT DUPLEX EXAM:  DUPLEX: 1. Elevated velocities noted in the right distal external iliac artery     suggestive of 50% to 75% stenosis with a peak systolic velocity of     217 cm/sec. 2. Mildly elevated velocities present at the left distal femoral to     popliteal bypass graft and distal anastomosis with a peak systolic     velocity of 166 cm/sec and 156 cm/sec.  IMPRESSION: 1. Patent right to left femoral to femoral bypass graft and left     femoral to popliteal bypass graft with stenosis as noted above. 2. Mildly decreased ankle brachial indices bilaterally since previous     study on 06/27/2010. 3. Incidental note is made of a hypoechoic area which does not appear     to be vascular in nature measuring 1.59 cm x 2.64 cm in the     popliteal fossa.  ___________________________________________ Quita Skye. [name], M.D.  SH/MEDQ  D:  11/30/2010  T:  11/30/2010  Job:  [id number]

## 2010-12-06 NOTE — Procedures (Unsigned)
CAROTID DUPLEX EXAM  INDICATION:  Carotid stenosis.  HISTORY: Diabetes:  Yes Cardiac:  CAD, CABG, MI September 2010 Hypertension:  Yes Smoking:  Currently Previous Surgery:  Right carotid endarterectomy in 1999; left carotid endarterectomy in 1995 CV History:  Asymptomatic Amaurosis Fugax No, Paresthesias No, Hemiparesis No                                      RIGHT             LEFT Brachial systolic pressure:         75                145 Brachial Doppler waveforms:         monophasic        WNL Vertebral direction of flow:        abnormal, to-and-fro                antegrade DUPLEX VELOCITIES (cm/sec) CCA peak systolic                   88                85 ECA peak systolic                   occluded          221 ICA peak systolic                   114               57-P/occluded-M ICA end diastolic                   35                10-P/occluded-M PLAQUE MORPHOLOGY:                  heterogeneous     heterogeneous PLAQUE AMOUNT:                      mild/occlusive    occlusive PLAQUE LOCATION:                    CCA, ICA, ECA     CCA, ICA, ECA  IMPRESSION: 1. Right internal carotid artery stenosis in the 1%-39% range with     history of endarterectomy. 2. Unable to obtain flow within the right external carotid artery,     suggesting occlusion. 3. Left internal carotid artery proximally is abnormal with turbulent     flow. 4. Unable to obtain color-flow or spectral waveform within the mid to     distal segment of the left internal carotid artery, suggestive of     occlusion. 5. Left external carotid artery stenosis present with a peak systolic     velocity of 221 cm/sec. 6. Right vertebral artery is abnormal with to-fro flow with stenotic     elevated velocities in the right subclavian and differences in     brachial pressure suggestive of subclavian steal syndrome.  ___________________________________________ Quita Skye [name],  M.D.  SH/MEDQ  D:  11/30/2010  T:  11/30/2010  Job:  [id number]

## 2010-12-20 ENCOUNTER — Encounter: Payer: Self-pay | Admitting: Vascular Surgery

## 2011-01-05 ENCOUNTER — Encounter: Payer: Self-pay | Admitting: Vascular Surgery

## 2011-01-09 ENCOUNTER — Ambulatory Visit: Payer: Medicare Other | Admitting: Vascular Surgery

## 2011-01-15 ENCOUNTER — Encounter: Payer: Self-pay | Admitting: Vascular Surgery

## 2011-01-16 ENCOUNTER — Ambulatory Visit (INDEPENDENT_AMBULATORY_CARE_PROVIDER_SITE_OTHER): Payer: Medicare Other | Admitting: Vascular Surgery

## 2011-01-16 ENCOUNTER — Encounter: Payer: Self-pay | Admitting: Vascular Surgery

## 2011-01-16 VITALS — BP 133/48 | HR 68 | Resp 20 | Ht 69.0 in | Wt 194.0 lb

## 2011-01-16 DIAGNOSIS — E1159 Type 2 diabetes mellitus with other circulatory complications: Secondary | ICD-10-CM

## 2011-01-16 DIAGNOSIS — I70219 Atherosclerosis of native arteries of extremities with intermittent claudication, unspecified extremity: Secondary | ICD-10-CM

## 2011-01-16 DIAGNOSIS — I6529 Occlusion and stenosis of unspecified carotid artery: Secondary | ICD-10-CM

## 2011-01-16 NOTE — Progress Notes (Signed)
Subjective:     Patient ID: Austin Diaz, male   DOB: 09-30-1945, 65 y.o.   MRN: [id number]  HPI this 65 year old male patient returns for continued followup regarding his surgery in January 2012 for ischemic left foot. He had a combination of aortoiliac and femoral popliteal occlusive disease of the left iliac occlusion. He underwent right to left femoral-femoral bypass and left femoral-popliteal bypass using a saphenous vein graft from left leg. His claudication symptoms are dramatically improved. He continues to have edema in the left leg which worsens as the day progresses and is about 1-2+ in severity. He has no history of DVT or thrombophlebitis. He denies any nonhealing ulcers infection or gangrene of the lower extremity.  Past Medical History  Diagnosis Date  . History of carotid endarterectomy     bilateral  . PVD (peripheral vascular disease)     noninvasive studies 10/2009 with ABI 0.35 on left and 0.70 on right  . SOB (shortness of breath)   . Hypertension     unspecified   . Nonfunctioning kidney     right kidney  . Coronary artery disease     s/p 2 v CABG 01/2009.  LIMA to LAD, SVG to OM  . Tobacco abuse   . Diabetes mellitus   . Myocardial infarction 01/2009  . Hyperlipidemia   . Pulmonary edema     history - not current    History  Substance Use Topics  . Smoking status: Current Everyday Smoker -- 2.0 packs/day for 50 years    Types: Cigarettes  . Smokeless tobacco: Not on file  . Alcohol Use: Yes     drinks beer several times per week    Family History  Problem Relation Age of Onset  . Osteoporosis Mother   . Stroke Mother     Allergies  Allergen Reactions  . Morphine     REACTION: cause hallucinations.    Current outpatient prescriptions:aspirin 325 MG EC tablet, Take 325 mg by mouth daily.  , Disp: , Rfl: ;  diazepam (VALIUM) 10 MG tablet, Take 10 mg by mouth as needed.  , Disp: , Rfl: ;  glipiZIDE (GLUCOTROL) 5 MG 24 hr tablet, Take 5 mg by mouth 2  (two) times daily. Take half a tab 2 times daily , Disp: , Rfl: ;  lisinopril (PRINIVIL,ZESTRIL) 20 MG tablet, Take 20 mg by mouth daily.  , Disp: , Rfl:  Multiple Vitamin (MULTIVITAMIN PO), Take by mouth at bedtime.  , Disp: , Rfl: ;  nitroGLYCERIN (NITROSTAT) 0.4 MG SL tablet, Place 0.4 mg under the tongue every 5 (five) minutes as needed.  , Disp: , Rfl: ;  pravastatin (PRAVACHOL) 40 MG tablet, Take 40 mg by mouth daily.  , Disp: , Rfl:   BP 133/48  Pulse 68  Resp 20  Ht 5\' 9"  (1.753 m)  Wt 194 lb (87.998 kg)  BMI 28.65 kg/m2  SpO2 97%  Body mass index is 28.65 kg/(m^2).        Review of Systems he denies chest pain dyspnea on exertion PND or followup in chronic bronchitis. He does have depression and urinary frequency. He continues to smoke one pack of cigarettes per day. All other systems are negative and the complete review of systems     Objective:   Physical Exam Blood pressure 133/48 heart rate 68 respirations 16 Gen. a well-developed well-nourished male in no apparent stress He is alert and oriented x3. HEENT exam normal for age Lungs clear no  rhonchi or wheezing Cardiovascular regular rhythm no murmurs Abdomen obese no palpable masses Neurologic exam normal Skin free of rashes Lower extremity exam reveals a 3+ femoral femoral graft pulse and a 2-3+ left popliteal graft pulse. Both feet are well perfused. Left foot has 1+ edema from the midcalf distally.  Data ordered lower extremity arterial Dopplers which are reviewed and interpreted. ABI on the left is 0.68 with biphasic flow right leg has an ABI of 0.46. Both of these are stable.    Assessment:     Well functioning right to left femoral-femoral bypass and left femoral-popliteal bypass. Known carotid occlusive disease currently asymptomatic.    Plan:     Return 6 months duplex scan of left femoral-popliteal bypass with ABI and carotid duplex exam Elevate legs at night to diminish edema left leg Offered to fit  him for short-leg elastic compression stocking which she declined today

## 2011-01-16 NOTE — Progress Notes (Signed)
Addended by: Sharee Pimple on: 01/16/2011 03:49 PM   Modules accepted: Orders

## 2011-04-03 ENCOUNTER — Other Ambulatory Visit (HOSPITAL_COMMUNITY): Payer: Self-pay | Admitting: Orthopedic Surgery

## 2011-04-03 DIAGNOSIS — M25569 Pain in unspecified knee: Secondary | ICD-10-CM

## 2011-04-05 ENCOUNTER — Ambulatory Visit (HOSPITAL_COMMUNITY): Payer: Medicare Other

## 2011-04-05 ENCOUNTER — Ambulatory Visit (HOSPITAL_COMMUNITY)
Admission: RE | Admit: 2011-04-05 | Discharge: 2011-04-05 | Disposition: A | Discharge status: Home / Self Care | Payer: Medicare Other | Source: Ambulatory Visit | Attending: Orthopedic Surgery | Admitting: Orthopedic Surgery

## 2011-04-05 DIAGNOSIS — R937 Abnormal findings on diagnostic imaging of other parts of musculoskeletal system: Secondary | ICD-10-CM | POA: Insufficient documentation

## 2011-04-05 DIAGNOSIS — M7120 Synovial cyst of popliteal space [Baker], unspecified knee: Secondary | ICD-10-CM | POA: Insufficient documentation

## 2011-04-05 DIAGNOSIS — M25569 Pain in unspecified knee: Secondary | ICD-10-CM

## 2011-04-05 DIAGNOSIS — M25469 Effusion, unspecified knee: Secondary | ICD-10-CM | POA: Insufficient documentation

## 2011-04-17 ENCOUNTER — Telehealth: Payer: Self-pay | Admitting: Cardiovascular Disease

## 2011-04-17 NOTE — Telephone Encounter (Signed)
New msg Pt's daughter called He is having orthoscopic surgery and needs surgical clearance. Please call her back.

## 2011-04-17 NOTE — Telephone Encounter (Signed)
Spoke with pt's daughter. Pt needs surgical clearance for knee surgery. Has seen Gene Serpe in [place] with plans to follow up with Dr. Andee Lineman.  Has seen Dr. Clifton James in past for PV.  Per daughter he is no longer seeing Dr. Andee Lineman and would like to see Dr. Clifton James for cardiac and PV.  Echo done in our office in April 2012 but pt cancelled follow up appt in May with Dr. Clifton James. Will need office visit before being cleared for surgery.  Appt made for pt to see Tereso Newcomer, PA on May 04, 2011 at 11:30.  Daughter aware this is at [facility] office

## 2011-05-04 ENCOUNTER — Ambulatory Visit: Payer: Medicare Other | Admitting: Physician Assistant

## 2011-05-18 ENCOUNTER — Encounter: Payer: Self-pay | Admitting: Physician Assistant

## 2011-05-18 ENCOUNTER — Ambulatory Visit (INDEPENDENT_AMBULATORY_CARE_PROVIDER_SITE_OTHER): Payer: Medicare Other | Admitting: Physician Assistant

## 2011-05-18 DIAGNOSIS — Z9889 Other specified postprocedural states: Secondary | ICD-10-CM

## 2011-05-18 DIAGNOSIS — I10 Essential (primary) hypertension: Secondary | ICD-10-CM

## 2011-05-18 DIAGNOSIS — I2510 Atherosclerotic heart disease of native coronary artery without angina pectoris: Secondary | ICD-10-CM

## 2011-05-18 DIAGNOSIS — M1990 Unspecified osteoarthritis, unspecified site: Secondary | ICD-10-CM | POA: Insufficient documentation

## 2011-05-18 DIAGNOSIS — I739 Peripheral vascular disease, unspecified: Secondary | ICD-10-CM

## 2011-05-18 NOTE — Assessment & Plan Note (Addendum)
He had bypass 9/10.  He has severe vascular disease.  He continues to smoke.  He has diabetes.  He cannot achieve 4 METs.  I suggest he undergo stress testing prior to being cleared for surgery.  We will arrange a Lexiscan Myoview.  He prefers to have this done in [place].  It will be arranged at [facility].    He has been lost to followup.  He lives in [place].  I will make sure he has follow up in our [place] clinic in 3 mos.

## 2011-05-18 NOTE — Assessment & Plan Note (Signed)
Followed by Dr. [name].  He apparently had Dopplers performed in 9/12.  I do not know the results.  As noted, I have advised him to check with Dr. [name] prior to proceeding with surgery to ensure that his carotid disease is stable.

## 2011-05-18 NOTE — Assessment & Plan Note (Signed)
Elevated today.  Usually is better.  Continue to monitor.

## 2011-05-18 NOTE — Patient Instructions (Signed)
Your physician has requested that you have a lexiscan myoview to be done at [facility] in [place], [place]. For further information please visit [email or web address] Please follow instruction sheet, as given.  Follow up with [facility] in [place] in 3 months.

## 2011-05-18 NOTE — Assessment & Plan Note (Signed)
Follow by Dr. [name].

## 2011-05-18 NOTE — Progress Notes (Signed)
[address] [place], [place]  [address] Phone: [phone] Fax:  [phone]  Date:  05/18/2011   Name:  Austin Diaz       DOB:  Oct 30, 1945 MRN:  [id number]  PCP:  Dr. Olena Leatherwood Primary Cardiologist:  Dr. Lewayne Bunting  Primary Electrophysiologist:  None PV:  Dr. Verne Carrow    History of Present Illness: Austin Diaz is a 66 y.o. male who presents for surgical clearance.  He needs left knee arthroscopic surgery.  He does not know the name of the surgeon.  He has a history of CAD, status post CABG in 9/10 (LIMA-LAD, SVG-OM).  LV function was normal at 65% by cardiac catheterization.  He has a history of PAD and has been followed by Dr. Verne Carrow for this.  After evaluation in December 2011, he underwent right to left fem-fem bypass grafting and left femoral to above-the-knee popliteal bypass grafting and right external iliac and common femoral artery endarterectomy in 2/12.  He is also status post bilateral CEAs.  Dopplers in 6/11 demonstrated mild disease on the right and a 100% occluded LICA with evidence of right subclavian steal (60 mmHg).  He established in our [facility] 11/11.  He was to followup in 6 mos but has not been seen since that time.  Other hx includes DM2, HTN, hyperlipidemia.    The patient denies chest pain, shortness of breath, syncope, orthopnea, PND or significant pedal edema. He is very limited by his knee pain.  He cannot achieve 4 METs.  He has been unable to do much activity since prior to his LE revascularization.  He continues to smoke.    Past Medical History  Diagnosis Date  . History of carotid endarterectomy     bilateral  . PVD (peripheral vascular disease)     noninvasive studies 10/2009 with ABI 0.35 on left and 0.70 on right  . SOB (shortness of breath)   . Hypertension     unspecified   . Nonfunctioning kidney     right kidney  . Coronary artery disease     s/p 2 v CABG 01/2009.  LIMA to LAD,  SVG to OM  . Tobacco abuse   . Diabetes mellitus   . Myocardial infarction 01/2009  . Hyperlipidemia   . Pulmonary edema     history - not current    Current Outpatient Prescriptions  Medication Sig Dispense Refill  . aspirin 325 MG EC tablet Take 325 mg by mouth daily.        . diazepam (VALIUM) 10 MG tablet Take 10 mg by mouth as needed.        Marland Kitchen glipiZIDE (GLUCOTROL) 5 MG 24 hr tablet Take 5 mg by mouth 2 (two) times daily. Take half a tab 2 times daily       . lisinopril (PRINIVIL,ZESTRIL) 20 MG tablet Take 20 mg by mouth daily.        . Multiple Vitamin (MULTIVITAMIN PO) Take by mouth at bedtime.        . nitroGLYCERIN (NITROSTAT) 0.4 MG SL tablet Place 0.4 mg under the tongue every 5 (five) minutes as needed.        . pravastatin (PRAVACHOL) 40 MG tablet Take 40 mg by mouth daily.          Allergies: Allergies  Allergen Reactions  . Morphine     REACTION: cause hallucinations.    History  Substance Use Topics  . Smoking status: Current Everyday  Smoker -- 2.0 packs/day for 50 years    Types: Cigarettes  . Smokeless tobacco: Not on file  . Alcohol Use: Yes     drinks beer several times per week     ROS:  Please see the history of present illness.    All other systems reviewed and negative.   PHYSICAL EXAM: VS:  BP 140/62  Pulse 62  Resp 20  Ht 5\' 9"  (1.753 m)  Wt 192 lb (87.091 kg)  BMI 28.35 kg/m2 Well nourished, well developed, in no acute distress HEENT: normal Neck: no JVD Cardiac:  normal S1, S2; RRR; no murmur Lungs:  Decreased breath sounds bilaterally, no wheezing, rhonchi or rales Abd: soft, nontender, no hepatomegaly Ext: no edema Skin: warm and dry Neuro:  CNs 2-12 intact, no focal abnormalities noted Psych: normal affect  EKG:   Sinus rhythm, rate 62, normal axis, nonspecific ST-T wave changes  ASSESSMENT AND PLAN:

## 2011-05-18 NOTE — Assessment & Plan Note (Addendum)
Proceed with stress testing as noted.  If his stress test is low risk, he should be able to proceed with arthroscopic surgery at acceptable risk from a cardiovascular standpoint.  I have suggested that he followup with Dr. [name] as I am unsure of the status of his carotid artery disease and whether or not this would preclude his surgery.

## 2011-05-21 ENCOUNTER — Ambulatory Visit: Payer: Medicare Other | Admitting: Physician Assistant

## 2011-05-22 ENCOUNTER — Telehealth: Payer: Self-pay | Admitting: Cardiology

## 2011-05-22 NOTE — Telephone Encounter (Signed)
Per Natale Lay office note of 05/18/11 need to schedule lexiscan myoview to be done at [facility]. Called Austin Diaz's home number and left message.

## 2011-05-25 ENCOUNTER — Other Ambulatory Visit: Payer: Self-pay | Admitting: Physician Assistant

## 2011-05-28 ENCOUNTER — Encounter (HOSPITAL_COMMUNITY): Payer: Self-pay | Admitting: Cardiology

## 2011-05-28 ENCOUNTER — Ambulatory Visit (HOSPITAL_COMMUNITY)
Admission: RE | Admit: 2011-05-28 | Discharge: 2011-05-28 | Disposition: A | Discharge status: Home / Self Care | Payer: Medicare Other | Source: Ambulatory Visit | Attending: Physician Assistant | Admitting: Physician Assistant

## 2011-05-28 ENCOUNTER — Ambulatory Visit (INDEPENDENT_AMBULATORY_CARE_PROVIDER_SITE_OTHER): Payer: Medicare Other | Admitting: *Deleted

## 2011-05-28 DIAGNOSIS — E785 Hyperlipidemia, unspecified: Secondary | ICD-10-CM | POA: Diagnosis not present

## 2011-05-28 DIAGNOSIS — Z01810 Encounter for preprocedural cardiovascular examination: Secondary | ICD-10-CM

## 2011-05-28 DIAGNOSIS — I2510 Atherosclerotic heart disease of native coronary artery without angina pectoris: Secondary | ICD-10-CM

## 2011-05-28 DIAGNOSIS — Z9889 Other specified postprocedural states: Secondary | ICD-10-CM | POA: Insufficient documentation

## 2011-05-28 DIAGNOSIS — I10 Essential (primary) hypertension: Secondary | ICD-10-CM | POA: Insufficient documentation

## 2011-05-28 DIAGNOSIS — E119 Type 2 diabetes mellitus without complications: Secondary | ICD-10-CM | POA: Insufficient documentation

## 2011-05-28 MED ORDER — TECHNETIUM TC 99M TETROFOSMIN IV KIT
30.0000 | PACK | Freq: Once | INTRAVENOUS | Status: AC | PRN
  Administered 2011-05-28: 30.2 via INTRAVENOUS

## 2011-05-28 MED ORDER — TECHNETIUM TC 99M TETROFOSMIN IV KIT
10.0000 | PACK | Freq: Once | INTRAVENOUS | Status: AC | PRN
  Administered 2011-05-28: 9.2 via INTRAVENOUS

## 2011-05-28 NOTE — Progress Notes (Signed)
Stress Lab Nurses Notes - [name]  Austin Diaz 05/28/2011  Reason for doing test: CAD and Surgical Clearance Type of test: Steffanie Dunn Nurse performing test: Parke Poisson, RN Nuclear Medicine Tech: Lyndel Pleasure Echo Tech: Not Applicable MD performing test: Ival Bible & Joni Reining NP Family MD: Hasanaj Test explained and consent signed: yes IV started: 22g jelco, Saline lock flushed, No redness or edema and Saline lock started in radiology Symptoms: none Treatment/Intervention: None Reason test stopped: protocol completed After recovery IV was: Discontinued via X-ray tech and No redness or edema Patient to return to Nuc. Med at : 12:40 pm Patient discharged: Home [name] Condition upon discharge was: stable Comments: During test BP 122/62 & HR 68.  Recovery BP 120/62 & HR 60.  Symptoms resolved in recovery. Austin Diaz

## 2011-05-30 ENCOUNTER — Encounter (HOSPITAL_COMMUNITY): Payer: Medicare Other

## 2011-05-30 ENCOUNTER — Telehealth: Payer: Self-pay | Admitting: *Deleted

## 2011-05-30 ENCOUNTER — Ambulatory Visit (HOSPITAL_COMMUNITY): Payer: Medicare Other

## 2011-05-30 NOTE — Telephone Encounter (Signed)
Left message to call back on (home) voicemail. No answer on cell phone.  (Appt scheduled w/Dr. Kirke Corin on 1/25.)

## 2011-05-30 NOTE — Telephone Encounter (Signed)
Pt notified of results and verbalized understanding.   Pt will be seen on 1/25 at 3:15 by Dr. Kirke Corin. Pt is agreeable to this plan.

## 2011-05-30 NOTE — Telephone Encounter (Signed)
Message copied by Arlyss Gandy on Wed May 30, 2011  8:30 AM ------      Message from: Tarri Fuller      Created: Tue May 29, 2011  4:58 PM       Call [place], [place] [phone] with any questions.            Wendee Copp            ----- Message -----         From: Beatrice Lecher, PA         Sent: 05/29/2011   1:56 PM           To: Roger Shelter. Fiato, CMA            Stress test abnormal with mild mid to apical anteroseptal ischemia and basal inferolateral ischemia      He likely needs a cardiac cath.      He needs follow up visit to discuss findings.      He is supposed to be followed in the [place] clinic.      He was seen there in the past and has not followed up.      He saw Dr. Verne Carrow in [facility] for PV only.      He was put on my schedule for surgical clearance.      He lives in [place].      Makes most sense for him to see someone in [place] (who will be his cardiologist going forward) to review stress test findings and decide if he needs cath or not       SO, I recommend we have him follow up in [place] in the next week for abnormal stress test      Tereso Newcomer, PA-C  1:55 PM 05/29/2011

## 2011-05-30 NOTE — Telephone Encounter (Signed)
Hi Scott,  Just wanted to keep you in the loop about Mr. Headen. Danielle Rankin

## 2011-06-01 ENCOUNTER — Ambulatory Visit (INDEPENDENT_AMBULATORY_CARE_PROVIDER_SITE_OTHER): Payer: Medicare Other | Admitting: Cardiovascular Disease

## 2011-06-01 ENCOUNTER — Encounter: Payer: Self-pay | Admitting: Cardiovascular Disease

## 2011-06-01 DIAGNOSIS — Z01810 Encounter for preprocedural cardiovascular examination: Secondary | ICD-10-CM | POA: Diagnosis not present

## 2011-06-01 DIAGNOSIS — I2510 Atherosclerotic heart disease of native coronary artery without angina pectoris: Secondary | ICD-10-CM

## 2011-06-01 NOTE — Progress Notes (Signed)
HPI  This is a 66 year old man is here today for a followup visit after an abnormal stress test. The patient has known history of coronary artery disease status post two-vessel CABG in 2010. He has extensive peripheral vascular disease. The patient has severe arthritis in his left knee and needs to have left knee surgery but not replacement. He denies any chest pain or dyspnea. He had a nuclear stress test done recently which showed small and mild anteroseptal ischemia as well as basal inferolateral ischemia. Ejection fraction was normal.  Allergies  Allergen Reactions  . Morphine     REACTION: cause hallucinations.     Current Outpatient Prescriptions on File Prior to Visit  Medication Sig Dispense Refill  . aspirin 325 MG EC tablet Take 325 mg by mouth daily.        Marland Kitchen glipiZIDE (GLUCOTROL) 5 MG 24 hr tablet Take 5 mg by mouth daily.       Marland Kitchen lisinopril (PRINIVIL,ZESTRIL) 20 MG tablet Take 40 mg by mouth daily.       . nitroGLYCERIN (NITROSTAT) 0.4 MG SL tablet Place 0.4 mg under the tongue every 5 (five) minutes as needed.           Past Medical History  Diagnosis Date  . History of carotid endarterectomy     bilateral  . PVD (peripheral vascular disease)     noninvasive studies 10/2009 with ABI 0.35 on left and 0.70 on right  . SOB (shortness of breath)   . Hypertension     unspecified   . Nonfunctioning kidney     right kidney  . Coronary artery disease     s/p 2 v CABG 01/2009.  LIMA to LAD, SVG to OM  . Tobacco abuse   . Diabetes mellitus   . Myocardial infarction 01/2009  . Hyperlipidemia   . Pulmonary edema     history - not current     Past Surgical History  Procedure Date  . Bilateral carotid endartectomy   . Renal artery bypass surgery     right   . Left hip replacement     x3  . Right hip replacement     x2  . Lumbar microdiskectomy   . Coronary artery bypass graft 01/2009    2 V CABG;  LIMA to LAD, SVG to OM  . Carotid endarterectomy 1999    right  .  Carotid endarterectomy 1995    left     Family History  Problem Relation Age of Onset  . Osteoporosis Mother   . Stroke Mother      History   Social History  . Marital Status: Divorced    Spouse Name: N/A    Number of Children: N/A  . Years of Education: N/A   Occupational History  . Not on file.   Social History Main Topics  . Smoking status: Current Everyday Smoker -- 2.0 packs/day for 50 years    Types: Cigarettes  . Smokeless tobacco: Never Used  . Alcohol Use: Yes     drinks beer several times per week  . Drug Use: No  . Sexually Active: Not on file   Other Topics Concern  . Not on file   Social History Narrative  . No narrative on file     PHYSICAL EXAM   BP 152/67  Pulse 67  Ht 5\' 9"  (1.753 m)  Wt 192 lb (87.091 kg)  BMI 28.35 kg/m2  Constitutional: He is oriented to person, place, and time.  He appears well-developed and well-nourished. No distress.  HENT: No nasal discharge.  Head: Normocephalic and atraumatic.  Eyes: Pupils are equal, round, and reactive to light. Right eye exhibits no discharge. Left eye exhibits no discharge.  Neck: Normal range of motion. Neck supple. No JVD present. No thyromegaly present. Bilateral carotid bruits Cardiovascular: Normal rate, regular rhythm, normal heart sounds. Exam reveals no gallop and no friction rub.  1/6 systolic ejection murmur at the aortic area  Pulmonary/Chest: Effort normal and breath sounds normal. No stridor. No respiratory distress. He has no wheezes. He has no rales. He exhibits no tenderness.  Abdominal: Soft. Bowel sounds are normal. He exhibits no distension. There is no tenderness. There is no rebound and no guarding.  Musculoskeletal: Normal range of motion. He exhibits no edema and no tenderness.  Neurological: He is alert and oriented to person, place, and time. Coordination normal.  Skin: Skin is warm and dry. No rash noted. He is not diaphoretic. No erythema. No pallor.  Psychiatric: He  has a normal mood and affect. His behavior is normal. Judgment and thought content normal.        ASSESSMENT AND PLAN

## 2011-06-01 NOTE — Assessment & Plan Note (Signed)
This is a preoperative cardiovascular evaluation for a planned left knee surgery. The patient has extensive cardiac and vascular history. He is status post CABG. He reports no chest pain or dyspnea. His nuclear stress test was reviewed by me. There is a small distal anteroseptal reversible defect with normal ejection fraction. I don't really see large areas of ischemia or high risk features. His ejection fraction is also normal. Thus, I think the patient can proceed with the planned knee surgery at an overall low to moderate risk. I don't think proceeding with cardiac catheterization at this time would be much helpful given that he has no symptoms of angina. I do recommend that he follows up in few months to reevaluate the need for further work up.

## 2011-06-01 NOTE — Patient Instructions (Signed)
Follow up as scheduled with Dr. Clifton James. Your physician recommends that you continue on your current medications as directed. Please refer to the Current Medication list given to you today. Your physician discussed the hazards of tobacco use. Tobacco use cessation is recommended and techniques and options to help you quit were discussed.

## 2011-06-08 HISTORY — PX: KNEE SURGERY: SHX244

## 2011-06-14 DIAGNOSIS — S838X9A Sprain of other specified parts of unspecified knee, initial encounter: Secondary | ICD-10-CM | POA: Diagnosis not present

## 2011-06-14 DIAGNOSIS — S86819A Strain of other muscle(s) and tendon(s) at lower leg level, unspecified leg, initial encounter: Secondary | ICD-10-CM | POA: Diagnosis not present

## 2011-06-14 DIAGNOSIS — M26609 Unspecified temporomandibular joint disorder, unspecified side: Secondary | ICD-10-CM | POA: Diagnosis not present

## 2011-06-14 DIAGNOSIS — M549 Dorsalgia, unspecified: Secondary | ICD-10-CM | POA: Diagnosis not present

## 2011-06-14 DIAGNOSIS — F411 Generalized anxiety disorder: Secondary | ICD-10-CM | POA: Diagnosis not present

## 2011-06-14 DIAGNOSIS — I10 Essential (primary) hypertension: Secondary | ICD-10-CM | POA: Diagnosis not present

## 2011-06-14 DIAGNOSIS — M76899 Other specified enthesopathies of unspecified lower limb, excluding foot: Secondary | ICD-10-CM | POA: Diagnosis not present

## 2011-06-28 ENCOUNTER — Other Ambulatory Visit: Payer: Self-pay | Admitting: Orthopedic Surgery

## 2011-07-13 ENCOUNTER — Encounter (HOSPITAL_BASED_OUTPATIENT_CLINIC_OR_DEPARTMENT_OTHER): Payer: Self-pay | Admitting: *Deleted

## 2011-07-16 ENCOUNTER — Encounter (HOSPITAL_BASED_OUTPATIENT_CLINIC_OR_DEPARTMENT_OTHER): Payer: Self-pay | Admitting: *Deleted

## 2011-07-16 ENCOUNTER — Other Ambulatory Visit: Payer: Medicare Other

## 2011-07-16 NOTE — Progress Notes (Signed)
NPO AFTER MN. ARRIVES AT 0845. NEEDS ISTAT . CURRENT EKG IN EPIC. WILL TAKE METOPROLOL AM OF SURG. W/ SIP  OF WATER.

## 2011-07-17 ENCOUNTER — Other Ambulatory Visit: Payer: Self-pay | Admitting: Orthopedic Surgery

## 2011-07-18 ENCOUNTER — Encounter (HOSPITAL_BASED_OUTPATIENT_CLINIC_OR_DEPARTMENT_OTHER): Payer: Self-pay | Admitting: *Deleted

## 2011-07-18 ENCOUNTER — Encounter (HOSPITAL_BASED_OUTPATIENT_CLINIC_OR_DEPARTMENT_OTHER): Payer: Self-pay | Admitting: Anesthesiology

## 2011-07-18 ENCOUNTER — Ambulatory Visit (HOSPITAL_BASED_OUTPATIENT_CLINIC_OR_DEPARTMENT_OTHER): Payer: Medicare Other | Admitting: Anesthesiology

## 2011-07-18 ENCOUNTER — Encounter (HOSPITAL_BASED_OUTPATIENT_CLINIC_OR_DEPARTMENT_OTHER)
Admission: RE | Disposition: A | Discharge status: Home / Self Care | Payer: Self-pay | Source: Ambulatory Visit | Attending: Orthopedic Surgery

## 2011-07-18 ENCOUNTER — Ambulatory Visit (HOSPITAL_BASED_OUTPATIENT_CLINIC_OR_DEPARTMENT_OTHER)
Admission: RE | Admit: 2011-07-18 | Discharge: 2011-07-18 | Disposition: A | Discharge status: Home / Self Care | Payer: Medicare Other | Source: Ambulatory Visit | Attending: Orthopedic Surgery | Admitting: Orthopedic Surgery

## 2011-07-18 DIAGNOSIS — I2510 Atherosclerotic heart disease of native coronary artery without angina pectoris: Secondary | ICD-10-CM | POA: Insufficient documentation

## 2011-07-18 DIAGNOSIS — M2390 Unspecified internal derangement of unspecified knee: Secondary | ICD-10-CM | POA: Diagnosis not present

## 2011-07-18 DIAGNOSIS — E119 Type 2 diabetes mellitus without complications: Secondary | ICD-10-CM | POA: Insufficient documentation

## 2011-07-18 DIAGNOSIS — Z79899 Other long term (current) drug therapy: Secondary | ICD-10-CM | POA: Diagnosis not present

## 2011-07-18 DIAGNOSIS — I252 Old myocardial infarction: Secondary | ICD-10-CM | POA: Insufficient documentation

## 2011-07-18 DIAGNOSIS — F17200 Nicotine dependence, unspecified, uncomplicated: Secondary | ICD-10-CM | POA: Insufficient documentation

## 2011-07-18 DIAGNOSIS — E785 Hyperlipidemia, unspecified: Secondary | ICD-10-CM | POA: Diagnosis not present

## 2011-07-18 DIAGNOSIS — I10 Essential (primary) hypertension: Secondary | ICD-10-CM | POA: Insufficient documentation

## 2011-07-18 DIAGNOSIS — I739 Peripheral vascular disease, unspecified: Secondary | ICD-10-CM | POA: Insufficient documentation

## 2011-07-18 DIAGNOSIS — S83289A Other tear of lateral meniscus, current injury, unspecified knee, initial encounter: Secondary | ICD-10-CM | POA: Insufficient documentation

## 2011-07-18 DIAGNOSIS — M129 Arthropathy, unspecified: Secondary | ICD-10-CM | POA: Diagnosis not present

## 2011-07-18 DIAGNOSIS — IMO0002 Reserved for concepts with insufficient information to code with codable children: Secondary | ICD-10-CM | POA: Diagnosis not present

## 2011-07-18 DIAGNOSIS — Z951 Presence of aortocoronary bypass graft: Secondary | ICD-10-CM | POA: Insufficient documentation

## 2011-07-18 DIAGNOSIS — Z96649 Presence of unspecified artificial hip joint: Secondary | ICD-10-CM | POA: Diagnosis not present

## 2011-07-18 DIAGNOSIS — M6750 Plica syndrome, unspecified knee: Secondary | ICD-10-CM | POA: Diagnosis not present

## 2011-07-18 DIAGNOSIS — X58XXXA Exposure to other specified factors, initial encounter: Secondary | ICD-10-CM | POA: Insufficient documentation

## 2011-07-18 HISTORY — DX: Presence of other vascular implants and grafts: Z95.828

## 2011-07-18 HISTORY — DX: Other tear of medial meniscus, current injury, left knee, initial encounter: S83.242A

## 2011-07-18 HISTORY — DX: Presence of aortocoronary bypass graft: Z95.1

## 2011-07-18 HISTORY — DX: Sprain of other specified parts of unspecified knee, initial encounter: S83.8X9A

## 2011-07-18 HISTORY — DX: Peripheral vascular disease, unspecified: I73.9

## 2011-07-18 HISTORY — DX: Unspecified osteoarthritis, unspecified site: M19.90

## 2011-07-18 HISTORY — DX: Old myocardial infarction: I25.2

## 2011-07-18 LAB — POCT I-STAT 4, (NA,K, GLUC, HGB,HCT)
Glucose, Bld: 95 mg/dL (ref 70–99)
Potassium: 3.6 mEq/L (ref 3.5–5.1)

## 2011-07-18 SURGERY — ARTHROSCOPY, KNEE, WITH PLICA EXCISION
Anesthesia: General | Site: Knee | Laterality: Left | Wound class: Clean

## 2011-07-18 MED ORDER — LACTATED RINGERS IV SOLN
INTRAVENOUS | Status: DC
  Administered 2011-07-18 (×3): via INTRAVENOUS

## 2011-07-18 MED ORDER — SODIUM CHLORIDE 0.9 % IR SOLN
Status: DC | PRN
  Administered 2011-07-18: 12:00:00

## 2011-07-18 MED ORDER — PROMETHAZINE HCL 25 MG/ML IJ SOLN: 6.2500 mg | INTRAMUSCULAR | Status: DC | PRN

## 2011-07-18 MED ORDER — FENTANYL CITRATE 0.05 MG/ML IJ SOLN
INTRAMUSCULAR | Status: DC | PRN
  Administered 2011-07-18 (×6): 50 ug via INTRAVENOUS

## 2011-07-18 MED ORDER — METHOCARBAMOL 500 MG PO TABS
500.0000 mg | ORAL_TABLET | Freq: Once | ORAL | Status: AC
  Administered 2011-07-18: 500 mg via ORAL

## 2011-07-18 MED ORDER — PROPOFOL 10 MG/ML IV EMUL
INTRAVENOUS | Status: DC | PRN
  Administered 2011-07-18: 300 mg via INTRAVENOUS

## 2011-07-18 MED ORDER — MIDAZOLAM HCL 5 MG/5ML IJ SOLN
INTRAMUSCULAR | Status: DC | PRN
  Administered 2011-07-18 (×2): 1 mg via INTRAVENOUS

## 2011-07-18 MED ORDER — BUPIVACAINE-EPINEPHRINE 0.5% -1:200000 IJ SOLN
INTRAMUSCULAR | Status: DC | PRN
  Administered 2011-07-18: 20 mL

## 2011-07-18 MED ORDER — CEFAZOLIN SODIUM 1-5 GM-% IV SOLN
INTRAVENOUS | Status: DC | PRN
  Administered 2011-07-18: 2 g via INTRAVENOUS

## 2011-07-18 MED ORDER — EPHEDRINE SULFATE 50 MG/ML IJ SOLN
INTRAMUSCULAR | Status: DC | PRN
  Administered 2011-07-18: 10 mg via INTRAVENOUS
  Administered 2011-07-18: 15 mg via INTRAVENOUS

## 2011-07-18 MED ORDER — OXYCODONE-ACETAMINOPHEN 5-325 MG PO TABS: 1.0000 | ORAL_TABLET | ORAL | Status: AC | PRN

## 2011-07-18 MED ORDER — OXYCODONE-ACETAMINOPHEN 5-325 MG PO TABS
1.0000 | ORAL_TABLET | Freq: Once | ORAL | Status: AC
  Administered 2011-07-18: 1 via ORAL

## 2011-07-18 MED ORDER — KETOROLAC TROMETHAMINE 30 MG/ML IJ SOLN: 15.0000 mg | Freq: Once | INTRAMUSCULAR | Status: DC | PRN

## 2011-07-18 MED ORDER — KETAMINE HCL 10 MG/ML IJ SOLN
INTRAMUSCULAR | Status: DC | PRN
  Administered 2011-07-18: 30 mg via INTRAVENOUS
  Administered 2011-07-18: 10 mg via INTRAVENOUS
  Administered 2011-07-18 (×2): 30 mg via INTRAVENOUS
  Administered 2011-07-18: 20 mg via INTRAVENOUS
  Administered 2011-07-18: 50 mg via INTRAVENOUS

## 2011-07-18 MED ORDER — METHOCARBAMOL 500 MG PO TABS: 500.0000 mg | ORAL_TABLET | Freq: Four times a day (QID) | ORAL | Status: AC | PRN

## 2011-07-18 MED ORDER — ONDANSETRON HCL 4 MG/2ML IJ SOLN
INTRAMUSCULAR | Status: DC | PRN
  Administered 2011-07-18: 4 mg via INTRAVENOUS

## 2011-07-18 MED ORDER — POVIDONE-IODINE 7.5 % EX SOLN: Freq: Once | CUTANEOUS | Status: DC

## 2011-07-18 MED ORDER — SODIUM CHLORIDE 0.9 % IR SOLN
Status: DC | PRN
  Administered 2011-07-18: 3000 mL

## 2011-07-18 MED ORDER — FENTANYL CITRATE 0.05 MG/ML IJ SOLN: 25.0000 ug | INTRAMUSCULAR | Status: DC | PRN

## 2011-07-18 MED ORDER — LIDOCAINE HCL (CARDIAC) 20 MG/ML IV SOLN
INTRAVENOUS | Status: DC | PRN
  Administered 2011-07-18: 80 mg via INTRAVENOUS

## 2011-07-18 SURGICAL SUPPLY — 56 items
BANDAGE ELASTIC 6 VELCRO ST LF (GAUZE/BANDAGES/DRESSINGS) ×3 IMPLANT
BANDAGE ESMARK 6X9 LF (GAUZE/BANDAGES/DRESSINGS) ×1 IMPLANT
BANDAGE GAUZE ELAST BULKY 4 IN (GAUZE/BANDAGES/DRESSINGS) ×2 IMPLANT
BLADE 4.2CUDA (BLADE) IMPLANT
BLADE CUDA 5.5 (BLADE) IMPLANT
BLADE CUDA SHAVER 3.5 (BLADE) ×2 IMPLANT
BLADE CUTTER GATOR 3.5 (BLADE) IMPLANT
BLADE GREAT WHITE 4.2 (BLADE) IMPLANT
BNDG CMPR 9X6 STRL LF SNTH (GAUZE/BANDAGES/DRESSINGS)
BNDG ESMARK 6X9 LF (GAUZE/BANDAGES/DRESSINGS)
CANISTER SUCT LVC 12 LTR MEDI- (MISCELLANEOUS) ×3 IMPLANT
CANISTER SUCTION 1200CC (MISCELLANEOUS) ×2 IMPLANT
CANISTER SUCTION 2500CC (MISCELLANEOUS) ×1 IMPLANT
CLOTH BEACON ORANGE TIMEOUT ST (SAFETY) ×2 IMPLANT
DRAPE ARTHROSCOPY W/POUCH 114 (DRAPES) ×2 IMPLANT
DRAPE LG THREE QUARTER DISP (DRAPES) ×2 IMPLANT
DRSG EMULSION OIL 3X3 NADH (GAUZE/BANDAGES/DRESSINGS) ×2 IMPLANT
DRSG PAD ABDOMINAL 8X10 ST (GAUZE/BANDAGES/DRESSINGS) ×1 IMPLANT
DURAPREP 26ML APPLICATOR (WOUND CARE) ×2 IMPLANT
ELECT MENISCUS 165MM 90D (ELECTRODE) IMPLANT
ELECT REM PT RETURN 9FT ADLT (ELECTROSURGICAL)
ELECTRODE REM PT RTRN 9FT ADLT (ELECTROSURGICAL) IMPLANT
GLOVE BIOGEL PI IND STRL 6.5 (GLOVE) IMPLANT
GLOVE BIOGEL PI IND STRL 7.0 (GLOVE) IMPLANT
GLOVE BIOGEL PI IND STRL 8 (GLOVE) ×1 IMPLANT
GLOVE BIOGEL PI INDICATOR 6.5 (GLOVE) ×2
GLOVE BIOGEL PI INDICATOR 7.0 (GLOVE) ×1
GLOVE BIOGEL PI INDICATOR 8 (GLOVE) ×1
GLOVE ECLIPSE 6.5 STRL STRAW (GLOVE) ×1 IMPLANT
GLOVE ECLIPSE 8.0 STRL XLNG CF (GLOVE) ×3 IMPLANT
GLOVE INDICATOR 8.0 STRL GRN (GLOVE) ×3 IMPLANT
GOWN PREVENTION PLUS LG XLONG (DISPOSABLE) ×3 IMPLANT
GOWN STRL REIN XL XLG (GOWN DISPOSABLE) ×2 IMPLANT
IV NS IRRIG 3000ML ARTHROMATIC (IV SOLUTION) ×5 IMPLANT
KNEE WRAP E Z 3 GEL PACK (MISCELLANEOUS) ×2 IMPLANT
NDL HYPO 18GX1.5 BLUNT FILL (NEEDLE) ×1 IMPLANT
NDL SAFETY ECLIPSE 18X1.5 (NEEDLE) ×1 IMPLANT
NEEDLE HYPO 18GX1.5 BLUNT FILL (NEEDLE) ×2 IMPLANT
NEEDLE HYPO 18GX1.5 SHARP (NEEDLE) ×2
PACK ARTHROSCOPY DSU (CUSTOM PROCEDURE TRAY) ×2 IMPLANT
PACK BASIN DAY SURGERY FS (CUSTOM PROCEDURE TRAY) ×2 IMPLANT
PADDING CAST ABS 4INX4YD NS (CAST SUPPLIES) ×1
PADDING CAST ABS COTTON 4X4 ST (CAST SUPPLIES) ×1 IMPLANT
PADDING CAST COTTON 6X4 STRL (CAST SUPPLIES) ×1 IMPLANT
PENCIL BUTTON HOLSTER BLD 10FT (ELECTRODE) IMPLANT
SET ARTHROSCOPY TUBING (MISCELLANEOUS) ×2
SET ARTHROSCOPY TUBING LN (MISCELLANEOUS) ×1 IMPLANT
SPONGE GAUZE 4X4 12PLY (GAUZE/BANDAGES/DRESSINGS) ×2 IMPLANT
SUT ETHIBOND 2 OS 4 DA (SUTURE) IMPLANT
SUT ETHILON 4 0 PS 2 18 (SUTURE) ×2 IMPLANT
SUT VIC AB 0 CT1 36 (SUTURE) IMPLANT
SUT VIC AB 2-0 PS2 27 (SUTURE) IMPLANT
SYRINGE 10CC LL (SYRINGE) ×2 IMPLANT
TOWEL OR 17X24 6PK STRL BLUE (TOWEL DISPOSABLE) ×2 IMPLANT
WAND 90 DEG TURBOVAC W/CORD (SURGICAL WAND) ×1 IMPLANT
WATER STERILE IRR 500ML POUR (IV SOLUTION) ×2 IMPLANT

## 2011-07-18 NOTE — Brief Op Note (Signed)
07/18/2011  3:05 PM  PATIENT:  Terressa Koyanagi  66 y.o. male  PRE-OPERATIVE DIAGNOSIS:  left knee torn medial meniscus and supra patellar plica  POST-OPERATIVE DIAGNOSIS:  left knee torn medial meniscus,lateral meniscus, supra-patellar plica  PROCEDURE:  Procedure(s) (LRB): KNEE ARTHROSCOPY WITH EXCISION PLICA (Left) KNEE ARTHROSCOPY WITH partial medial and lateral meniscectomies  SURGEON:  Surgeon(s) and Role:    * Drucilla Schmidt, MD - Primary  PHYSICIAN ASSISTANT:   ASSISTANTS: Mr Idolina Primer [facility]  ANESTHESIA:   general  EBL:  Total I/O In: 1990 [P.O.:240; I.V.:1750] Out: -   BLOOD ADMINISTERED:none  DRAINS: none   LOCAL MEDICATIONS USED:  NONE  SPECIMEN:  No Specimen  DISPOSITION OF SPECIMEN:  N/A  COUNTS:  YES  TOURNIQUET:  * Missing tourniquet times found for documented tourniquets in log:  [id number] *  DICTATION: .Other Dictation: Dictation Number [id number]  PLAN OF CARE: Discharge to home after PACU  PATIENT DISPOSITION:  PACU - hemodynamically stable.   Delay start of Pharmacological VTE agent (>24hrs) due to surgical blood loss or risk of bleeding: not applicable

## 2011-07-18 NOTE — Anesthesia Preprocedure Evaluation (Addendum)
Anesthesia Evaluation  Patient identified by MRN, date of birth, ID band Patient awake    Reviewed: Allergy & Precautions, H&P , NPO status , [name] Chart, lab work & pertinent test results  Airway Mallampati: II TM Distance: >3 FB Neck ROM: Full    Dental No notable dental hx.    Pulmonary Current Smoker,  breath sounds clear to auscultation  Pulmonary exam normal       Cardiovascular hypertension, + CAD, + Past MI, + CABG and + Peripheral Vascular Disease Rhythm:Regular Rate:Normal     Neuro/Psych negative neurological ROS  negative psych ROS   GI/Hepatic negative GI ROS, Neg liver ROS, Regular etoh use   Endo/Other    Renal/GU negative Renal ROS  negative genitourinary   Musculoskeletal negative musculoskeletal ROS (+)   Abdominal   Peds negative pediatric ROS (+)  Hematology negative hematology ROS (+)   Anesthesia Other Findings   Reproductive/Obstetrics negative OB ROS                          Anesthesia Physical Anesthesia Plan  ASA: III  Anesthesia Plan: General   Post-op Pain Management:    Induction: Intravenous  Airway Management Planned: LMA  Additional Equipment:   Intra-op Plan:   Post-operative Plan:   Informed Consent: I have reviewed the patients History and Physical, chart, labs and discussed the procedure including the risks, benefits and alternatives for the proposed anesthesia with the patient or authorized representative who has indicated his/her understanding and acceptance.   Dental advisory given  Plan Discussed with: CRNA  Anesthesia Plan Comments:         Anesthesia Quick Evaluation

## 2011-07-18 NOTE — Discharge Instructions (Signed)
Use walker as needed. Keep dressing clean and dry. Call office to be seen this Friday. Ice to knee as needed.

## 2011-07-18 NOTE — H&P (Signed)
Austin Diaz is an 66 y.o. male.   Chief Complaint: Pain in L. Knee. HPI: Progressive and persistent pain with ROM and wt. Bearing L. Knee.  Past Medical History  Diagnosis Date  . History of carotid endarterectomy     bilateral  . PVD (peripheral vascular disease)     noninvasive studies 10/2009 with ABI 0.35 on left and 0.70 on right  . SOB (shortness of breath)   . Hypertension     unspecified   . Nonfunctioning kidney     right kidney  . Tobacco abuse   . Diabetes mellitus   . Hyperlipidemia   . History of non-ST elevation myocardial infarction (NSTEMI) SEPT 2010  . S/P CABG x 2   . S/P femoral-popliteal bypass surgery   . Acute medial meniscus tear of left knee   . Arthritis   . Coronary artery disease CARDIOLOGIST- DR ARIDA -- LAST  VISIT 06-01-11  IN EPIC---   PT DENIES S & S    s/p 2 v CABG 01/2009.  LIMA to LAD, SVG to OM  . PAD (peripheral artery disease)   . Claudication in peripheral vascular disease   . Acute medial meniscal injury of knee LEFT    Past Surgical History  Procedure Date  . Bilateral carotid endartectomy RIGHT 1999/   LEFT 1995  . Left hip replacement     x3  LAST ONE 2003  . Right hip replacement     x2  . Right to left fem-fem bypass graft/ left fem-pop bypass graft/ right external iliac & common femoral endarterectomy 05-31-2010  . Coronary artery bypass graft 02-03-2009    X22 VESSEL CABG;  LIMA to LAD, SVG to OM  . Cardiac catheterization 09- 24 & 27- 2010    HIGH-GRADE LEFT MAIN AND 2 VESSEL CAD,  S/P NSTEMI  . Renal artery angioplasty 05-26-2010    RIGHT COMMMON ILIAC ARTERY  . Lumbar disc surgery YRS AGO  . Transthoracic echocardiogram 08-16-2010    NORMAL LVSF/ EF 55-60%/ MODERATELY DILATED LEFT ATRIUM  . Lexiscan myoview 05-28-2011  DR ARIDA ([facility] , [place])    SMALL DISTAL ANTEROSEPTAL REVERSIBLE DEFECT WITH NORMAL EF 60%./  NO LARGE AREAS OF ISCHEMIA OR HIGH RISK FEATURES.  . Cataract extraction w/ intraocular lens  implant     RIGHT    Family History  Problem Relation Age of Onset  . Osteoporosis Mother   . Stroke Mother    Social History:  reports that he has been smoking Cigarettes.  He has a 50 pack-year smoking history. He has never used smokeless tobacco. He reports that he drinks about 7.2 ounces of alcohol per week. He reports that he does not use illicit drugs.  Allergies:  Allergies  Allergen Reactions  . Morphine Other (See Comments)    HALLUCINATIONS    Medications Prior to Admission  Medication Dose Route Frequency Provider Last Rate Last Dose  . lactated ringers infusion   Intravenous Continuous Jill Side, MD 100 mL/hr at 07/18/11 0945    . povidone-iodine (BETADINE) 7.5 % scrub   Topical Once Drucilla Schmidt, MD       Medications Prior to Admission  Medication Sig Dispense Refill  . amLODipine (NORVASC) 10 MG tablet Take 5 mg by mouth every evening.       Marland Kitchen aspirin 325 MG EC tablet Take 325 mg by mouth daily.       Marland Kitchen gabapentin (NEURONTIN) 400 MG capsule Take 400 mg by mouth  daily.      . glipiZIDE (GLUCOTROL) 5 MG 24 hr tablet Take 5 mg by mouth daily.       Marland Kitchen HYDROcodone-acetaminophen (NORCO) 10-325 MG per tablet Take 1 tablet by mouth at bedtime.      Marland Kitchen ibuprofen (ADVIL,MOTRIN) 200 MG tablet Take 600 mg by mouth every morning. And 400 mg at night      . lisinopril (PRINIVIL,ZESTRIL) 20 MG tablet Take 40 mg by mouth daily.       . metoprolol succinate (TOPROL-XL) 100 MG 24 hr tablet Take 50 mg by mouth every morning. Take with or immediately following a meal.      . nitroGLYCERIN (NITROSTAT) 0.4 MG SL tablet Place 0.4 mg under the tongue every 5 (five) minutes as needed.       . terazosin (HYTRIN) 2 MG capsule Take 4 mg by mouth at bedtime.      . vitamin B-12 (CYANOCOBALAMIN) 500 MCG tablet Take 500 mcg by mouth daily.        Results for orders placed during the hospital encounter of 07/18/11 (from the past 48 hour(s))  POCT I-STAT 4, (NA,K, GLUC, HGB,HCT)      Status: Normal   Collection Time   07/18/11  9:40 AM      Component Value Range Comment   Sodium 139  135 - 145 (mEq/L)    Potassium 3.6  3.5 - 5.1 (mEq/L)    Glucose, Bld 95  70 - 99 (mg/dL)    HCT 16.1  09.6 - 04.5 (%)    Hemoglobin 14.3  13.0 - 17.0 (g/dL)    No results found.  Review of Systems  Constitutional: Negative.   HENT: Negative.   Eyes: Negative.   Respiratory: Negative.   Cardiovascular: Negative.   Gastrointestinal: Negative.   Genitourinary: Negative.   Musculoskeletal: Positive for joint pain.  Skin: Negative.   Psychiatric/Behavioral: Negative.     Blood pressure 160/61, pulse 59, temperature 98.5 F (36.9 C), temperature source Oral, resp. rate 18, height 5\' 9"  (1.753 m), weight 86.637 kg (191 lb), SpO2 99.00%. Physical Exam  Constitutional: He appears well-developed and well-nourished.  HENT:  Head: Normocephalic and atraumatic.  Eyes: Conjunctivae are normal. Pupils are equal, round, and reactive to light.  Neck: Normal range of motion. Neck supple.  Cardiovascular: Regular rhythm.   GI: Soft. Bowel sounds are normal.  Musculoskeletal: He exhibits tenderness.  Skin: Skin is warm.  Psychiatric: He has a normal mood and affect.     Assessment/Plan Torn medial meniscus and plica left knee.  Left knee scope, partial excision med. Meniscus and plica remover.  Kindra Bickham III,Henning Ehle L 07/18/2011, 11:02 AM

## 2011-07-18 NOTE — Transfer of Care (Signed)
Immediate Anesthesia Transfer of Care Note  Patient: Austin Diaz  Procedure(s) Performed: Procedure(s) (LRB): KNEE ARTHROSCOPY WITH EXCISION PLICA (Left) KNEE ARTHROSCOPY WITH MEDIAL MENISECTOMY (Left)  Patient Location: Patient transported to PACU with oxygen via face mask at 4 Liters / Min  Anesthesia Type: General  Level of Consciousness: awake and alert   Airway & Oxygen Therapy: Patient Spontanous Breathing and Patient connected to face mask oxygen  Post-op Assessment: Report given to PACU RN and Post -op Vital signs reviewed and stable  Post vital signs: Reviewed and stable  Dentition: Teeth and oropharynx remain in pre-op condition  Complications: No apparent anesthesia complications

## 2011-07-18 NOTE — Anesthesia Procedure Notes (Signed)
Procedure Name: LMA Insertion Date/Time: 07/18/2011 11:18 AM Performed by: Fran Lowes Pre-anesthesia Checklist: Patient identified, Emergency Drugs available, Suction available and Patient being monitored Patient Re-evaluated:Patient Re-evaluated prior to inductionOxygen Delivery Method: Circle System Utilized Preoxygenation: Pre-oxygenation with 100% oxygen Intubation Type: IV induction Ventilation: Mask ventilation without difficulty LMA: LMA inserted LMA Size: 5.0 Number of attempts: 2 Airway Equipment and Method: bite block Placement Confirmation: positive ETCO2 Tube secured with: Tape Dental Injury: Teeth and Oropharynx as per pre-operative assessment  Comments: 1st attempt with LMA supreme #4. Not a good seal. Replaced with LMA #5.

## 2011-07-18 NOTE — Anesthesia Postprocedure Evaluation (Signed)
  Anesthesia Post-op Note  Patient: Austin Diaz  Procedure(s) Performed: Procedure(s) (LRB): KNEE ARTHROSCOPY WITH EXCISION PLICA (Left) KNEE ARTHROSCOPY WITH MEDIAL MENISECTOMY (Left)  Patient Location: PACU  Anesthesia Type: General  Level of Consciousness: awake and alert   Airway and Oxygen Therapy: Patient Spontanous Breathing  Post-op Pain: mild  Post-op Assessment: Post-op Vital signs reviewed, [name] Cardiovascular Status Stable, Respiratory Function Stable, Patent Airway and No signs of Nausea or vomiting  Post-op Vital Signs: stable  Complications: No apparent anesthesia complications

## 2011-07-19 NOTE — Op Note (Signed)
NAME:  Austin Diaz, Austin Diaz NO.:  [id number]  MEDICAL RECORD NO.:  [id number]  LOCATION:                               FACILITY:  [facility]  PHYSICIAN:  Marlowe Kays, M.D.  DATE OF BIRTH:  1946-02-04  DATE OF PROCEDURE:  07/18/2011 DATE OF DISCHARGE:                              OPERATIVE REPORT   PREOPERATIVE DIAGNOSES: 1. Torn medial meniscus. 2. Suprapatellar plica, left knee.  POSTOPERATIVE DIAGNOSES: 1. Torn medial and lateral menisci. 2. Suprapatellar plica, left knee.  OPERATION:  Left knee arthroscopy with: 1. Partial medial lateral meniscectomy. 2. Excision of large suprapatellar plica.  SURGEON:  Marlowe Kays, M.D.  ASSISTANT:  Nurse.  ANESTHESIA:  General.  PATHOLOGY AND JUSTIFICATION FOR PROCEDURE:  Painful left knee with an MRI demonstrating preoperative diagnoses.  DESCRIPTION OF PROCEDURE:  Satisfied general anesthesia, prophylactic antibiotics, he has had to bilateral total hips.  A pneumatic tourniquet was applied, but not inflated because of fem-pop bypass and taking care to try and minimize any risk to clotting off or damaging the graft.  Ace wrap and knee support to right lower extremity, thigh stabilizer to left lower extremity.  The left leg prepped with DuraPrep from tourniquet from stabilizer to ankle, draped in sterile field.  Time-out performed. Superior lateral saline inflow.  First, an anterolateral portal, medial compartment knee joint was evaluated.  Typically, the case was compounded because of having no tourniquet and bleeding as well as difficulty with the inflow.  I first began because of the inflow problems looking suprapatellar area, and found basically a curtain__________ covering the entire suprapatellar area, which I resected with a combination of ArthroCare vaporizer and the 3.5 shaver.  In the medial compartment knee joint, there was a lot of reactive tissue.  The medial meniscus was torn throughout its extent  and I resected it back to stable rim with a combination of baskets and the 3.5 shaver.  On reversing portals, there was also a lot of reactive tissue in the lateral joint as well as an extensively torn lateral meniscus.  I debrided out the joint and shaved down the lateral meniscus until nice and smooth.  The knee joint was then irrigated thoroughly and the 2 entry portals were closed with 4-0 nylon.  I then injected 20 cc of 0.5% Marcaine with adrenaline through the inflow apparatus.  HE WAS ALLERGIC TO MORPHINE and no morphine was utilized.  His portal was then closed with 4-0 nylon as well.  Betadine, Adaptic, dry sterile dressing were applied.  He tolerated the procedure well.  At time of this dictation, he was on his way to recovery room in satisfactory condition with no known complications.          ______________________________ Marlowe Kays, M.D.     JA/MEDQ  D:  07/18/2011  T:  07/19/2011  Job:  [id number]

## 2011-08-16 ENCOUNTER — Ambulatory Visit (INDEPENDENT_AMBULATORY_CARE_PROVIDER_SITE_OTHER): Payer: Medicare Other | Admitting: Cardiovascular Disease

## 2011-08-16 ENCOUNTER — Encounter: Payer: Self-pay | Admitting: Cardiovascular Disease

## 2011-08-16 ENCOUNTER — Telehealth: Payer: Self-pay | Admitting: Cardiovascular Disease

## 2011-08-16 VITALS — BP 130/77 | HR 52 | Ht 68.0 in | Wt 196.0 lb

## 2011-08-16 DIAGNOSIS — F17200 Nicotine dependence, unspecified, uncomplicated: Secondary | ICD-10-CM | POA: Diagnosis not present

## 2011-08-16 DIAGNOSIS — I2510 Atherosclerotic heart disease of native coronary artery without angina pectoris: Secondary | ICD-10-CM | POA: Diagnosis not present

## 2011-08-16 DIAGNOSIS — I779 Disorder of arteries and arterioles, unspecified: Secondary | ICD-10-CM | POA: Diagnosis not present

## 2011-08-16 DIAGNOSIS — I739 Peripheral vascular disease, unspecified: Secondary | ICD-10-CM

## 2011-08-16 MED ORDER — ASPIRIN EC 81 MG PO TBEC: 81.0000 mg | DELAYED_RELEASE_TABLET | Freq: Every day | ORAL | Status: DC

## 2011-08-16 NOTE — Assessment & Plan Note (Signed)
He is advised to stop smoking.

## 2011-08-16 NOTE — Assessment & Plan Note (Signed)
He is stable post CABG. No chest pains. He is doing well. He does not tolerate statins due to weakness and fatigue. Will lower ASA to 81 mg po Qdaily. Continue beta blocker. He wishes to follow up with Dr. Kirke Corin in the [place] office at the next visit. Will arrange f/u in [place] office. Will check fasting lipids and LFTs at next visit.

## 2011-08-16 NOTE — Telephone Encounter (Signed)
Pt was  In today and told dr Clifton James that he was out of gavapentine but when he checked [facility] it was not there, he does prefer nurotin if possible, pls call in

## 2011-08-16 NOTE — Assessment & Plan Note (Signed)
His LE arterial disease and carotid disease is followed in the [facility] office by Dr. [name].

## 2011-08-16 NOTE — Progress Notes (Signed)
History of Present Illness: 66 y.o. male with history of DM, HLD, tobacco abuse, HTN, CAD s/p 2V CABG 9/10 (LIMA to LAD, SVG to OM)  and PAD who presents today for PV follow up.  He has a history of CAD, status post CABG in 9/10 (LIMA-LAD, SVG-OM). LV function was normal at 65% by cardiac catheterization and normal by echo April 2012. He was last seen by Dr. Kirke Corin in our [place] office 06/01/11 before left knee surgery. Myoview showed small and mild anteroseptal ischemia as well as basal inferolateral ischemia. Ejection fraction was normal. He has a history of PAD followed in my office  After my evaluation in December 2011, he underwent right to left fem-fem bypass grafting and left femoral to above-the-knee popliteal bypass grafting and right external iliac and common femoral artery endarterectomy in 2/12. He is also status post bilateral CEAs. He continues to smoke.   He tells me that he is feeling well. He has had no chest pain or SOB. No near syncope or syncope. His left knee is sore after surgery with some swelling but this is getting better. No claudication.   Primary Care Physician: Hasanaj  Last Lipid Profile: Not checked in last year.   Past Medical History  Diagnosis Date  . History of carotid endarterectomy     bilateral  . PVD (peripheral vascular disease)     noninvasive studies 10/2009 with ABI 0.35 on left and 0.70 on right  . SOB (shortness of breath)   . Hypertension     unspecified   . Nonfunctioning kidney     right kidney  . Tobacco abuse   . Diabetes mellitus   . Hyperlipidemia   . History of non-ST elevation myocardial infarction (NSTEMI) SEPT 2010  . S/P CABG x 2   . S/P femoral-popliteal bypass surgery   . Acute medial meniscus tear of left knee   . Arthritis   . Coronary artery disease CARDIOLOGIST- DR ARIDA -- LAST  VISIT 06-01-11  IN EPIC---   PT DENIES S & S    s/p 2 v CABG 01/2009.  LIMA to LAD, SVG to OM  . PAD (peripheral artery disease)   . Claudication in  peripheral vascular disease   . Acute medial meniscal injury of knee LEFT    Past Surgical History  Procedure Date  . Bilateral carotid endartectomy RIGHT 1999/   LEFT 1995  . Left hip replacement     x3  LAST ONE 2003  . Right hip replacement     x2  . Right to left fem-fem bypass graft/ left fem-pop bypass graft/ right external iliac & common femoral endarterectomy 05-31-2010  . Coronary artery bypass graft 02-03-2009    X22 VESSEL CABG;  LIMA to LAD, SVG to OM  . Cardiac catheterization 09- 24 & 27- 2010    HIGH-GRADE LEFT MAIN AND 2 VESSEL CAD,  S/P NSTEMI  . Renal artery angioplasty 05-26-2010    RIGHT COMMMON ILIAC ARTERY  . Lumbar disc surgery YRS AGO  . Transthoracic echocardiogram 08-16-2010    NORMAL LVSF/ EF 55-60%/ MODERATELY DILATED LEFT ATRIUM  . Lexiscan myoview 05-28-2011  DR ARIDA ([facility] [place])    SMALL DISTAL ANTEROSEPTAL REVERSIBLE DEFECT WITH NORMAL EF 60%./  NO LARGE AREAS OF ISCHEMIA OR HIGH RISK FEATURES.  . Cataract extraction w/ intraocular lens implant     RIGHT    Current Outpatient Prescriptions  Medication Sig Dispense Refill  . amLODipine (NORVASC) 10 MG tablet Take  5 mg by mouth every evening.       Marland Kitchen aspirin 325 MG EC tablet Take 325 mg by mouth daily.       Marland Kitchen glipiZIDE (GLUCOTROL) 5 MG 24 hr tablet Take 5 mg by mouth daily.       Marland Kitchen HYDROcodone-acetaminophen (NORCO) 10-325 MG per tablet Take 1 tablet by mouth at bedtime.      Marland Kitchen ibuprofen (ADVIL,MOTRIN) 200 MG tablet Take 600 mg by mouth every morning. And 400 mg at night      . lisinopril (PRINIVIL,ZESTRIL) 20 MG tablet Take 40 mg by mouth daily.       . metoprolol succinate (TOPROL-XL) 100 MG 24 hr tablet Take 50 mg by mouth every morning. Take with or immediately following a meal.      . nitroGLYCERIN (NITROSTAT) 0.4 MG SL tablet Place 0.4 mg under the tongue every 5 (five) minutes as needed.       . terazosin (HYTRIN) 2 MG capsule Take 4 mg by mouth at bedtime.      . vitamin B-12  (CYANOCOBALAMIN) 500 MCG tablet Take 500 mcg by mouth daily.        Allergies  Allergen Reactions  . Morphine Other (See Comments)    HALLUCINATIONS    History   Social History  . Marital Status: Divorced    Spouse Name: N/A    Number of Children: N/A  . Years of Education: N/A   Occupational History  . Not on file.   Social History Main Topics  . Smoking status: Current Everyday Smoker -- 1.0 packs/day for 50 years    Types: Cigarettes  . Smokeless tobacco: Never Used   Comment: PT HAS CUT BACK FROM 2 PPD TO 1 PPD SINCE JAN 2012  . Alcohol Use: 7.2 oz/week    12 Cans of beer per week     drinks beer several times per week  . Drug Use: No  . Sexually Active: Not on file   Other Topics Concern  . Not on file   Social History Narrative  . No narrative on file    Family History  Problem Relation Age of Onset  . Osteoporosis Mother   . Stroke Mother     Review of Systems:  As stated in the HPI and otherwise negative.   BP 130/77  Pulse 52  Ht 5\' 8"  (1.727 m)  Wt 196 lb (88.905 kg)  BMI 29.80 kg/m2  Physical Examination: General: Well developed, well nourished, NAD HEENT: OP clear, mucus membranes moist SKIN: warm, dry. No rashes. Neuro: No focal deficits Musculoskeletal: Muscle strength 5/5 all ext Psychiatric: Mood and affect normal Neck: No JVD, bilateral carotid bruits, no thyromegaly, no lymphadenopathy. Lungs:Clear bilaterally, no wheezes, rhonci, crackles Cardiovascular: Regular rate and rhythm. Systolic murmur. No gallops or rubs. Abdomen:Soft. Bowel sounds present. Non-tender.  Extremities: No lower extremity edema. Pulses are palpable in the bilateral DP/PT.

## 2011-08-16 NOTE — Patient Instructions (Signed)
Your physician wants you to follow-up in: 6 months with Dr. Kirke Corin in [place] office.  You will receive a reminder letter in the mail two months in advance. If you don't receive a letter, please call our office to schedule the follow-up appointment.  Your physician has recommended you make the following change in your medication: Decrease aspirin to 81 mg by mouth daily.  Your physician recommends that you return for fasting lab work in: 6 months on day of appt with Dr. Mickie Kay and Liver profile

## 2011-08-16 NOTE — Telephone Encounter (Signed)
Reviewed with Dr. Clifton James and OK to refill.  I called pt to confirm dosage of 400 mg daily. Left message to call back

## 2011-08-21 ENCOUNTER — Telehealth: Payer: Self-pay | Admitting: Cardiovascular Disease

## 2011-08-21 NOTE — Telephone Encounter (Signed)
Our records indicate pt was on 400 mg daily but this was removed from med list at last office visit as pt had not been taking. I spoke with pt and he does not remember dose he was taking and has thrown out all the bottles. He states he always fills medication at [facility]. I told pt I would contact pharmacy to verify dose.  I spoke with pharmacist at [facility] and they have not filled prescription since 2011 and dosage was different than what our records indicate. Prescription was written by pt's primary MD.  I called and spoke with pt and told him we could not refill as we could not confirm dosage.  He has not seen primary MD recently but will contact him to see if he will refill.

## 2011-08-21 NOTE — Telephone Encounter (Signed)
Patient returning nurse call, he can be reached at [phone]

## 2011-08-21 NOTE — Telephone Encounter (Signed)
Left message to call back  

## 2011-08-21 NOTE — Telephone Encounter (Signed)
This is a call back from note started August 16, 2010. See that note for details

## 2011-08-22 ENCOUNTER — Telehealth: Payer: Self-pay

## 2011-08-22 NOTE — Telephone Encounter (Signed)
Pt. called w/ c/o continued swelling in top of left foot, (L) ankle and (L) calf area.   States "it has never completely gone away since surgery". (hx left fem-pop BP, (R)-(L) Fem-Fem BP and (R) iliac/femoral Endart. 05/31/10)  Denies any swelling in the right leg/foot.  Denies any numbness/tingling of extremities.  Denies any inflammation of left calf, or increased tenderness.  States top of left foot has increased redness compared to right foot.  Denies any open sores. Pt was due for 6 mo vascular protocol studies in March.  Will reschedule vascular labs missed, and appt. w/ Dr. [name], as pt. requests to further discuss swelling w/ him.  Does state the swelling improves somewhat at night, and worsens during the day.  Per office note of 01/2011, pt. advised to continue to elevate left leg and use compression stockings. Verb. Understanding.

## 2011-08-29 ENCOUNTER — Other Ambulatory Visit: Payer: Medicare Other

## 2011-09-17 ENCOUNTER — Encounter: Payer: Self-pay | Admitting: Neurosurgery

## 2011-09-18 ENCOUNTER — Encounter (INDEPENDENT_AMBULATORY_CARE_PROVIDER_SITE_OTHER): Payer: Medicare Other | Admitting: *Deleted

## 2011-09-18 ENCOUNTER — Other Ambulatory Visit (INDEPENDENT_AMBULATORY_CARE_PROVIDER_SITE_OTHER): Payer: Medicare Other | Admitting: *Deleted

## 2011-09-18 ENCOUNTER — Ambulatory Visit (INDEPENDENT_AMBULATORY_CARE_PROVIDER_SITE_OTHER): Payer: Medicare Other | Admitting: Neurosurgery

## 2011-09-18 ENCOUNTER — Encounter: Payer: Self-pay | Admitting: Neurosurgery

## 2011-09-18 VITALS — BP 118/49 | HR 66 | Resp 12 | Ht 68.5 in | Wt 200.0 lb

## 2011-09-18 DIAGNOSIS — I739 Peripheral vascular disease, unspecified: Secondary | ICD-10-CM

## 2011-09-18 DIAGNOSIS — Z48812 Encounter for surgical aftercare following surgery on the circulatory system: Secondary | ICD-10-CM

## 2011-09-18 DIAGNOSIS — I70219 Atherosclerosis of native arteries of extremities with intermittent claudication, unspecified extremity: Secondary | ICD-10-CM

## 2011-09-18 DIAGNOSIS — I6529 Occlusion and stenosis of unspecified carotid artery: Secondary | ICD-10-CM | POA: Insufficient documentation

## 2011-09-18 NOTE — Progress Notes (Addendum)
[facility] [name] HISTORY AND PHYSICAL   CC: Six-month followup for lower extremity duplex graft scan, ABIs, carotid duplex for known carotid stenosis and PAD Referring Physician: [name]  History of Present Illness: This is a 66 year old male patient has a significant vascular history. Dr. [name] performed a right to left femoropopliteal bypass graft and left femoropopliteal bypass graft with a CFA endarterectomy in January 2012. [name] also got a remote history of a right CEA in 1999 a left CEA in 1995 he also has a cardiac history of a CABG in the past with an MI in September 2010. Patient only has complaints today of left lower extremity swelling which was addressed by Dr. [name] at his last appointment in September of 2012. The patient states that he does try to elevate his leg but he hasn't been doing that regularly. He states his, by a traction type device to put on his bed to keep his leg elevated at night. We also discussed the need for compression stockings on the left lower extremity which he has not done and Dr. [name] recommended at his last appointment. The patient states he will also try to get that taken care of him his on and start using that regularly also. Patient reports no signs or symptoms of claudication, CVA, TIA, amaurosis fugax or word finding difficulty.  Past Medical History  Diagnosis Date  . History of carotid endarterectomy     bilateral  . PVD (peripheral vascular disease)     noninvasive studies 10/2009 with ABI 0.35 on left and 0.70 on right  . SOB (shortness of breath)   . Hypertension     unspecified   . Nonfunctioning kidney     right kidney  . Tobacco abuse   . Diabetes mellitus   . Hyperlipidemia   . History of non-ST elevation myocardial infarction (NSTEMI) SEPT 2010  . S/P CABG x 2   . S/P femoral-popliteal bypass surgery   . Acute medial meniscus tear of left knee   . Arthritis   . Coronary artery disease CARDIOLOGIST- DR  ARIDA -- LAST  VISIT 06-01-11  IN EPIC---   PT DENIES S & S    s/p 2 v CABG 01/2009.  LIMA to LAD, SVG to OM  . PAD (peripheral artery disease)   . Claudication in peripheral vascular disease   . Acute medial meniscal injury of knee LEFT    ROS: [x]  Positive   [ ]  Denies    General: [ ]  Weight loss, [ ]  Fever, [ ]  chills Neurologic: [ ]  Dizziness, [ ]  Blackouts, [ ]  Seizure [ ]  Stroke, [ ]  "Mini stroke", [ ]  Slurred speech, [ ]  Temporary blindness; [ ]  weakness in arms or legs, [ ]  Hoarseness Cardiac: [ ]  Chest pain/pressure, [ ]  Shortness of breath at rest [ ]  Shortness of breath with exertion, [ ]  Atrial fibrillation or irregular heartbeat Vascular: [ ]  Pain in legs with walking, [ ]  Pain in legs at rest, [ ]  Pain in legs at night,  [ ]  Non-healing ulcer, [ ]  Blood clot in vein/DVT,   Pulmonary: [ ]  Home oxygen, [ ]  Productive cough, [ ]  Coughing up blood, [ ]  Asthma,  [ ]  Wheezing Musculoskeletal:  [ ]  Arthritis, [ ]  Low back pain, [ ]  Joint pain Hematologic: [ ]  Easy Bruising, [ ]  Anemia; [ ]  Hepatitis Gastrointestinal: [ ]  Blood in stool, [ ]  Gastroesophageal Reflux/heartburn, [ ]  Trouble swallowing Urinary: [ ]  chronic Kidney disease, [ ]   on HD - [ ]  MWF or [ ]  TTHS, [ ]  Burning with urination, [ ]  Difficulty urinating Skin: [ ]  Rashes, [ ]  Wounds Psychological: [ ]  Anxiety, [ ]  Depression   Social History History  Substance Use Topics  . Smoking status: Current Everyday Smoker -- 1.0 packs/day for 50 years    Types: Cigarettes  . Smokeless tobacco: Never Used   Comment: PT HAS CUT BACK FROM 2 PPD TO 1 PPD SINCE JAN 2012  . Alcohol Use: 7.2 oz/week    12 Cans of beer per week     drinks beer several times per week    Family History Family History  Problem Relation Age of Onset  . Osteoporosis Mother   . Stroke Mother     Allergies  Allergen Reactions  . Morphine Other (See Comments)    HALLUCINATIONS    Current Outpatient Prescriptions  Medication Sig Dispense  Refill  . aspirin 81 MG tablet Take 1 tablet (81 mg total) by mouth daily.  30 tablet  1  . glipiZIDE (GLUCOTROL) 5 MG 24 hr tablet Take 5 mg by mouth daily.       Marland Kitchen HYDROcodone-acetaminophen (NORCO) 10-325 MG per tablet Take 1 tablet by mouth at bedtime.      Marland Kitchen ibuprofen (ADVIL,MOTRIN) 200 MG tablet Take 600 mg by mouth every morning. And 400 mg at night      . lisinopril (PRINIVIL,ZESTRIL) 20 MG tablet Take 40 mg by mouth daily.       . metoprolol succinate (TOPROL-XL) 100 MG 24 hr tablet Take 50 mg by mouth every morning. Take with or immediately following a meal.      . nitroGLYCERIN (NITROSTAT) 0.4 MG SL tablet Place 0.4 mg under the tongue every 5 (five) minutes as needed.       . terazosin (HYTRIN) 2 MG capsule Take 4 mg by mouth at bedtime.      . vitamin B-12 (CYANOCOBALAMIN) 500 MCG tablet Take 500 mcg by mouth daily.      Marland Kitchen amLODipine (NORVASC) 10 MG tablet Take 5 mg by mouth every evening.         Physical Examination  Filed Vitals:   09/18/11 1206  BP: 118/49  Pulse: 66  Resp: 12    Body mass index is 29.97 kg/(m^2).  General:  WDWN in NAD Gait: Normal HEENT: WNL Eyes: Pupils equal Pulmonary: normal non-labored breathing , without Rales, rhonchi,  wheezing Cardiac: RRR, without  Murmurs, rubs or gallops; Abdomen: soft, NT, no masses Skin: no rashes, ulcers noted  Vascular Exam Pulses: Patient has palpable 2+ radial pulses on the left negative on the right, very dampened femoral pulses bilaterally lower extremity pulses are also minimal bilaterally. Carotid bruits: Patient has dampened carotid pulses to auscultation no bruits are heard Extremities without ischemic changes, no Gangrene , no cellulitis; no open wounds;  Musculoskeletal: no muscle wasting or atrophy   Neurologic: A&O X 3; Appropriate Affect ; SENSATION: normal; MOTOR FUNCTION:  moving all extremities equally. Speech is fluent/normal  Non-Invasive Vascular Imaging CAROTID DUPLEX 09/18/2011  Right ICA  20 - 39 % stenosis Left ICA 20 - 39 % stenosis Lower extremity graft shows a patent graft with no elevated velocities, ABIs today are 0.71 on the right with biphasic to monophasic flow, 0.95 on the left with biphasic flow  ASSESSMENT/PLAN: Assessment as above, the patient return in 6 months for repeat carotid duplex, bypass graft scan, and ABIs. He is also to obtain compression stockings  for that left lower extremity and try to keep his foot elevated as much as possible. The patient also tells me does not have a primary care at this point due to a disagreement and he has been encouraged to become established with another primary care Dr. as he is asking about a diuretic to help with the swelling. His questions were encouraged and answered.  Lauree Chandler ANP   Clinic MD: [name]

## 2011-09-18 NOTE — Progress Notes (Signed)
Addended by: Sharee Pimple on: 09/18/2011 02:30 PM   Modules accepted: Orders

## 2011-09-24 NOTE — Procedures (Unsigned)
BYPASS GRAFT EVALUATION  INDICATION:  Follow up peripheral vascular disease and bypass graft placement  HISTORY: Diabetes:  Yes Cardiac:  CAD, CABG and MI 01/2009 Hypertension:  Yes Smoking:  Currently Previous Surgery:  Right femoral to left femoral bypass graft; left femoral to popliteal bypass graft; right external iliac artery/common femoral artery endarterectomy, all done on 05/31/2010  SINGLE LEVEL ARTERIAL EXAM                              RIGHT              LEFT Brachial: Anterior tibial: Posterior tibial: Peroneal: Ankle/brachial index:        0.71               0.95  PREVIOUS ABI:  Date:  11/30/2010  RIGHT:  0.46  LEFT:  0.68  LOWER EXTREMITY BYPASS GRAFT DUPLEX EXAM:  DUPLEX:  Elevated velocities noted in the right distal external iliac artery. Mildly elevated velocities present at the left distal femoral to popliteal bypass graft and distal anastomosis.  IMPRESSION: 1. Patent right to left femoral to femoral bypass graft and left     femoral to popliteal bypass graft with velocity measurements shown     on the following worksheet. 2. Improvement bilaterally in ankle brachial indices in comparison to     the previous exam on 11/30/2010. 3. Incidental note is made of a hypoechoic area which does not appear     to be vascular in nature measuring 2.54 x 2.67 cm in the popliteal     fossa of the left lower extremity.  ___________________________________________ Quita Skye [name], M.D.  EM/MEDQ  D:  09/18/2011  T:  09/18/2011  Job:  [id number]

## 2011-09-24 NOTE — Procedures (Unsigned)
CAROTID DUPLEX EXAM  INDICATION:  Carotid disease  HISTORY: Diabetes:  Yes Cardiac:  Coronary artery disease, CABG, MI 01/2009 Hypertension:  Yes Smoking:  Current Previous Surgery:  Right CEA 1999, left CEA 1995 CV History:  Currently asymptomatic Amaurosis Fugax No, Paresthesias No, Hemiparesis No                                      RIGHT             LEFT Brachial systolic pressure:         74                147 Brachial Doppler waveforms:         Monophasic        Within normal limits Vertebral direction of flow:        To and fro        Antegrade DUPLEX VELOCITIES (cm/sec) CCA peak systolic                   66                76 ECA peak systolic                   Occluded          235 ICA peak systolic                   111               P=59, M=occluded ICA end diastolic                   35                P=10, M=occluded PLAQUE MORPHOLOGY:                  Mixed             Mixed PLAQUE AMOUNT:                      Minimal           Occlusive PLAQUE LOCATION:                    CCA, ICA, ECA     CCA, ICA, ECA  IMPRESSION: 1. Right internal carotid artery velocity suggests 1%-39% stenosis     with history of carotid endarterectomy. 2. Known right external carotid artery occlusion. 3. Left internal carotid artery is abnormal proximally with no flow     visualized in the mid and distal segments suggestive of occlusion. 4. Left external carotid artery stenosis present. 5. Right vertebral artery is abnormal with to and fro flow with     differences in brachial pressures noted.  ___________________________________________ Quita Skye [name], M.D.  EM/MEDQ  D:  09/18/2011  T:  09/18/2011  Job:  [id number]

## 2012-01-31 DIAGNOSIS — E119 Type 2 diabetes mellitus without complications: Secondary | ICD-10-CM | POA: Diagnosis not present

## 2012-01-31 DIAGNOSIS — H538 Other visual disturbances: Secondary | ICD-10-CM | POA: Diagnosis not present

## 2012-01-31 DIAGNOSIS — H2510 Age-related nuclear cataract, unspecified eye: Secondary | ICD-10-CM | POA: Diagnosis not present

## 2012-02-05 ENCOUNTER — Encounter (HOSPITAL_COMMUNITY): Payer: Self-pay | Admitting: Pharmacy Technician

## 2012-02-05 ENCOUNTER — Encounter (HOSPITAL_COMMUNITY)
Admission: RE | Admit: 2012-02-05 | Discharge: 2012-02-05 | Payer: Medicare Other | Source: Ambulatory Visit | Attending: Ophthalmology | Admitting: Ophthalmology

## 2012-02-05 ENCOUNTER — Encounter (HOSPITAL_COMMUNITY): Payer: Self-pay

## 2012-02-05 DIAGNOSIS — Z01812 Encounter for preprocedural laboratory examination: Secondary | ICD-10-CM | POA: Diagnosis not present

## 2012-02-05 DIAGNOSIS — Z951 Presence of aortocoronary bypass graft: Secondary | ICD-10-CM | POA: Diagnosis not present

## 2012-02-05 DIAGNOSIS — H538 Other visual disturbances: Secondary | ICD-10-CM | POA: Diagnosis not present

## 2012-02-05 DIAGNOSIS — I10 Essential (primary) hypertension: Secondary | ICD-10-CM | POA: Diagnosis not present

## 2012-02-05 DIAGNOSIS — H2510 Age-related nuclear cataract, unspecified eye: Secondary | ICD-10-CM | POA: Diagnosis not present

## 2012-02-05 DIAGNOSIS — E119 Type 2 diabetes mellitus without complications: Secondary | ICD-10-CM | POA: Diagnosis not present

## 2012-02-05 HISTORY — DX: Anxiety disorder, unspecified: F41.9

## 2012-02-05 HISTORY — DX: Depression, unspecified: F32.A

## 2012-02-05 HISTORY — DX: Major depressive disorder, single episode, unspecified: F32.9

## 2012-02-05 LAB — BASIC METABOLIC PANEL
Calcium: 9.1 mg/dL (ref 8.4–10.5)
Creatinine, Ser: 1.35 mg/dL (ref 0.50–1.35)
GFR calc non Af Amer: 53 mL/min — ABNORMAL LOW (ref >90)
Sodium: 134 mEq/L — ABNORMAL LOW (ref 135–145)

## 2012-02-05 LAB — HEMOGLOBIN AND HEMATOCRIT, BLOOD
HCT: 34.9 % — ABNORMAL LOW (ref 39.0–52.0)
Hemoglobin: 11.4 g/dL — ABNORMAL LOW (ref 13.0–17.0)

## 2012-02-05 NOTE — Patient Instructions (Addendum)
Your procedure is scheduled on: 02/11/2012  Report to [facility] at  800      AM.  Call this number if you have problems the morning of surgery: [phone]   Do not eat food or drink liquids :After Midnight.      Take these medicines the morning of surgery with A SIP OF WATER: norco,norvasc,lisinopril,hytrin,metoprolol   Do not wear jewelry, make-up or nail polish.  Do not wear lotions, powders, or perfumes. You may wear deodorant.  Do not shave 48 hours prior to surgery.  Do not bring valuables to the hospital.  Contacts, dentures or bridgework may not be worn into surgery.  Leave suitcase in the car. After surgery it may be brought to your room.  For patients admitted to the hospital, checkout time is 11:00 AM the day of discharge.   Patients discharged the day of surgery will not be allowed to drive home.  :     Please read over the following fact sheets that you were given: Coughing and Deep Breathing, Surgical Site Infection Prevention, Anesthesia Post-op Instructions and Care and Recovery After Surgery    Cataract A cataract is a clouding of the lens of the eye. When a lens becomes cloudy, vision is reduced based on the degree and nature of the clouding. Many cataracts reduce vision to some degree. Some cataracts make people more near-sighted as they develop. Other cataracts increase glare. Cataracts that are ignored and become worse can sometimes look white. The white color can be seen through the pupil. CAUSES   Aging. However, cataracts may occur at any age, even in newborns.   Certain drugs.   Trauma to the eye.   Certain diseases such as diabetes.   Specific eye diseases such as chronic inflammation inside the eye or a sudden attack of a rare form of glaucoma.   Inherited or acquired medical problems.  SYMPTOMS   Gradual, progressive drop in vision in the affected eye.   Severe, rapid visual loss. This most often happens when trauma is the cause.  DIAGNOSIS  To  detect a cataract, an eye doctor examines the lens. Cataracts are best diagnosed with an exam of the eyes with the pupils enlarged (dilated) by drops.  TREATMENT  For an early cataract, vision may improve by using different eyeglasses or stronger lighting. If that does not help your vision, surgery is the only effective treatment. A cataract needs to be surgically removed when vision loss interferes with your everyday activities, such as driving, reading, or watching TV. A cataract may also have to be removed if it prevents examination or treatment of another eye problem. Surgery removes the cloudy lens and usually replaces it with a substitute lens (intraocular lens, IOL).  At a time when both you and your doctor agree, the cataract will be surgically removed. If you have cataracts in both eyes, only one is usually removed at a time. This allows the operated eye to heal and be out of danger from any possible problems after surgery (such as infection or poor wound healing). In rare cases, a cataract may be doing damage to your eye. In these cases, your caregiver may advise surgical removal right away. The vast majority of people who have cataract surgery have better vision afterward. HOME CARE INSTRUCTIONS  If you are not planning surgery, you may be asked to do the following:  Use different eyeglasses.   Use stronger or brighter lighting.   Ask your eye doctor about reducing  your medicine dose or changing medicines if it is thought that a medicine caused your cataract. Changing medicines does not make the cataract go away on its own.   Become familiar with your surroundings. Poor vision can lead to injury. Avoid bumping into things on the affected side. You are at a higher risk for tripping or falling.   Exercise extreme care when driving or operating machinery.   Wear sunglasses if you are sensitive to bright light or experiencing problems with glare.  SEEK IMMEDIATE MEDICAL CARE IF:   You have  a worsening or sudden vision loss.   You notice redness, swelling, or increasing pain in the eye.   You have a fever.  Document Released: 04/23/2005 Document Revised: 04/12/2011 Document Reviewed: 12/15/2010 [facility] Patient Information 2012 [place], [place].PATIENT INSTRUCTIONS POST-ANESTHESIA  IMMEDIATELY FOLLOWING SURGERY:  Do not drive or operate machinery for the first twenty four hours after surgery.  Do not make any important decisions for twenty four hours after surgery or while taking narcotic pain medications or sedatives.  If you develop intractable nausea and vomiting or a severe headache please notify your doctor immediately.  FOLLOW-UP:  Please make an appointment with your surgeon as instructed. You do not need to follow up with anesthesia unless specifically instructed to do so.  WOUND CARE INSTRUCTIONS (if applicable):  Keep a dry clean dressing on the anesthesia/puncture wound site if there is drainage.  Once the wound has quit draining you may leave it open to air.  Generally you should leave the bandage intact for twenty four hours unless there is drainage.  If the epidural site drains for more than 36-48 hours please call the anesthesia department.  QUESTIONS?:  Please feel free to call your physician or the hospital operator if you have any questions, and they will be happy to assist you.

## 2012-02-11 ENCOUNTER — Ambulatory Visit (HOSPITAL_COMMUNITY): Payer: Medicare Other | Admitting: Anesthesiology

## 2012-02-11 ENCOUNTER — Ambulatory Visit (HOSPITAL_COMMUNITY)
Admission: RE | Admit: 2012-02-11 | Discharge: 2012-02-11 | Disposition: A | Discharge status: Home / Self Care | Payer: Medicare Other | Source: Ambulatory Visit | Attending: Ophthalmology | Admitting: Ophthalmology

## 2012-02-11 ENCOUNTER — Encounter (HOSPITAL_COMMUNITY)
Admission: RE | Disposition: A | Discharge status: Home / Self Care | Payer: Self-pay | Source: Ambulatory Visit | Attending: Ophthalmology

## 2012-02-11 ENCOUNTER — Encounter (HOSPITAL_COMMUNITY): Payer: Self-pay | Admitting: Anesthesiology

## 2012-02-11 ENCOUNTER — Encounter (HOSPITAL_COMMUNITY): Payer: Self-pay | Admitting: *Deleted

## 2012-02-11 DIAGNOSIS — Z951 Presence of aortocoronary bypass graft: Secondary | ICD-10-CM | POA: Diagnosis not present

## 2012-02-11 DIAGNOSIS — I10 Essential (primary) hypertension: Secondary | ICD-10-CM | POA: Insufficient documentation

## 2012-02-11 DIAGNOSIS — H269 Unspecified cataract: Secondary | ICD-10-CM | POA: Diagnosis not present

## 2012-02-11 DIAGNOSIS — Z01812 Encounter for preprocedural laboratory examination: Secondary | ICD-10-CM | POA: Diagnosis not present

## 2012-02-11 DIAGNOSIS — H538 Other visual disturbances: Secondary | ICD-10-CM | POA: Diagnosis not present

## 2012-02-11 DIAGNOSIS — E119 Type 2 diabetes mellitus without complications: Secondary | ICD-10-CM | POA: Insufficient documentation

## 2012-02-11 DIAGNOSIS — H2510 Age-related nuclear cataract, unspecified eye: Secondary | ICD-10-CM | POA: Insufficient documentation

## 2012-02-11 HISTORY — PX: CATARACT EXTRACTION W/PHACO: SHX586

## 2012-02-11 LAB — GLUCOSE, CAPILLARY: Glucose-Capillary: 83 mg/dL (ref 70–99)

## 2012-02-11 SURGERY — PHACOEMULSIFICATION, CATARACT, WITH IOL INSERTION
Anesthesia: Monitor Anesthesia Care | Site: Eye | Laterality: Left | Wound class: Clean

## 2012-02-11 MED ORDER — LIDOCAINE 3.5 % OP GEL OPTIME - NO CHARGE
OPHTHALMIC | Status: DC | PRN
  Administered 2012-02-11: 1 [drp] via OPHTHALMIC

## 2012-02-11 MED ORDER — GATIFLOXACIN 0.5 % OP SOLN OPTIME - NO CHARGE
1.0000 [drp] | Freq: Once | OPHTHALMIC | Status: AC
  Administered 2012-02-11: 1 [drp] via OPHTHALMIC
  Filled 2012-02-11: qty 2.5

## 2012-02-11 MED ORDER — LACTATED RINGERS IV SOLN
INTRAVENOUS | Status: DC | PRN
  Administered 2012-02-11: 10:00:00 via INTRAVENOUS

## 2012-02-11 MED ORDER — NA HYALUR & NA CHOND-NA HYALUR 0.55-0.5 ML IO KIT
PACK | INTRAOCULAR | Status: DC | PRN
  Administered 2012-02-11: 1 via OPHTHALMIC

## 2012-02-11 MED ORDER — KETOROLAC TROMETHAMINE 0.4 % OP SOLN - NO CHARGE
1.0000 [drp] | Freq: Once | OPHTHALMIC | Status: AC
  Administered 2012-02-11: 1 [drp] via OPHTHALMIC
  Filled 2012-02-11: qty 5

## 2012-02-11 MED ORDER — EPINEPHRINE HCL 1 MG/ML IJ SOLN
INTRAOCULAR | Status: DC | PRN
  Administered 2012-02-11: 10:00:00

## 2012-02-11 MED ORDER — MIDAZOLAM HCL 2 MG/2ML IJ SOLN
INTRAMUSCULAR | Status: AC
  Filled 2012-02-11: qty 2

## 2012-02-11 MED ORDER — MIDAZOLAM HCL 2 MG/2ML IJ SOLN
1.0000 mg | INTRAMUSCULAR | Status: DC | PRN
  Administered 2012-02-11: 2 mg via INTRAVENOUS

## 2012-02-11 MED ORDER — TETRACAINE HCL 0.5 % OP SOLN
OPHTHALMIC | Status: AC
  Filled 2012-02-11: qty 2

## 2012-02-11 MED ORDER — LACTATED RINGERS IV SOLN
INTRAVENOUS | Status: DC
  Administered 2012-02-11: 1000 mL via INTRAVENOUS

## 2012-02-11 MED ORDER — LIDOCAINE HCL 3.5 % OP GEL: 1.0000 "application " | Freq: Once | OPHTHALMIC | Status: DC

## 2012-02-11 MED ORDER — TETRACAINE 0.5 % OP SOLN OPTIME - NO CHARGE
OPHTHALMIC | Status: DC | PRN
  Administered 2012-02-11: 1 [drp] via OPHTHALMIC

## 2012-02-11 MED ORDER — TETRACAINE HCL 0.5 % OP SOLN: 1.0000 [drp] | Freq: Once | OPHTHALMIC | Status: DC

## 2012-02-11 MED ORDER — CYCLOPENTOLATE HCL 1 % OP SOLN
1.0000 [drp] | Freq: Once | OPHTHALMIC | Status: AC
  Administered 2012-02-11: 1 [drp] via OPHTHALMIC

## 2012-02-11 MED ORDER — LIDOCAINE HCL 3.5 % OP GEL
OPHTHALMIC | Status: AC
  Filled 2012-02-11: qty 5

## 2012-02-11 MED ORDER — MOXIFLOXACIN HCL 0.5 % OP SOLN - NO CHARGE
1.0000 [drp] | Freq: Once | OPHTHALMIC | Status: DC
  Filled 2012-02-11: qty 3

## 2012-02-11 MED ORDER — GATIFLOXACIN 0.5 % OP SOLN OPTIME - NO CHARGE
OPHTHALMIC | Status: DC | PRN
  Administered 2012-02-11: 1 [drp] via OPHTHALMIC

## 2012-02-11 MED ORDER — BSS IO SOLN
INTRAOCULAR | Status: DC | PRN
  Administered 2012-02-11: 15 mL via INTRAOCULAR

## 2012-02-11 SURGICAL SUPPLY — 27 items
CAPSULAR TENSION RING-AMO (OPHTHALMIC RELATED) IMPLANT
CLOTH BEACON ORANGE TIMEOUT ST (SAFETY) ×1 IMPLANT
GLOVE BIO SURGEON STRL SZ7.5 (GLOVE) IMPLANT
GLOVE BIOGEL M 6.5 STRL (GLOVE) IMPLANT
GLOVE BIOGEL PI IND STRL 6.5 (GLOVE) IMPLANT
GLOVE BIOGEL PI IND STRL 7.0 (GLOVE) IMPLANT
GLOVE BIOGEL PI INDICATOR 6.5 (GLOVE)
GLOVE BIOGEL PI INDICATOR 7.0 (GLOVE) ×1
GLOVE ECLIPSE 6.5 STRL STRAW (GLOVE) IMPLANT
GLOVE ECLIPSE 7.5 STRL STRAW (GLOVE) IMPLANT
GLOVE EXAM NITRILE LRG STRL (GLOVE) IMPLANT
GLOVE EXAM NITRILE MD LF STRL (GLOVE) ×1 IMPLANT
GLOVE SKINSENSE NS SZ6.5 (GLOVE)
GLOVE SKINSENSE NS SZ7.0 (GLOVE)
GLOVE SKINSENSE STRL SZ6.5 (GLOVE) IMPLANT
GLOVE SKINSENSE STRL SZ7.0 (GLOVE) IMPLANT
INST SET CATARACT ~~LOC~~ (KITS) ×2 IMPLANT
KIT VITRECTOMY (OPHTHALMIC RELATED) IMPLANT
PAD ARMBOARD 7.5X6 YLW CONV (MISCELLANEOUS) ×1 IMPLANT
PROC W NO LENS (INTRAOCULAR LENS)
PROC W SPEC LENS (INTRAOCULAR LENS)
PROCESS W NO LENS (INTRAOCULAR LENS) IMPLANT
PROCESS W SPEC LENS (INTRAOCULAR LENS) IMPLANT
RING MALYGIN (MISCELLANEOUS) IMPLANT
SIGHTPATH CAT PROC W REG LENS (Ophthalmic Related) ×2 IMPLANT
VISCOELASTIC ADDITIONAL (OPHTHALMIC RELATED) IMPLANT
WATER STERILE IRR 250ML POUR (IV SOLUTION) ×1 IMPLANT

## 2012-02-11 NOTE — Anesthesia Preprocedure Evaluation (Signed)
Anesthesia Evaluation  Patient identified by MRN, date of birth, ID band Patient awake    Reviewed: Allergy & Precautions, H&P , NPO status , [name] Chart, lab work & pertinent test results  Airway Mallampati: III      Dental  (+) Edentulous Lower and Upper Dentures   Pulmonary shortness of breath and with exertion,    Pulmonary exam normal       Cardiovascular hypertension, + CAD, + Past MI, + CABG and + Peripheral Vascular Disease + dysrhythmias Ventricular Tachycardia Rhythm:Regular     Neuro/Psych PSYCHIATRIC DISORDERS Anxiety Depression    GI/Hepatic   Endo/Other  diabetes, Type 2, Oral Hypoglycemic Agents  Renal/GU Renal InsufficiencyRenal disease     Musculoskeletal   Abdominal   Peds  Hematology   Anesthesia Other Findings   Reproductive/Obstetrics                           Anesthesia Physical Anesthesia Plan  ASA: III  Anesthesia Plan: MAC   Post-op Pain Management:    Induction: Intravenous  Airway Management Planned: Nasal Cannula  Additional Equipment:   Intra-op Plan:   Post-operative Plan:   Informed Consent: I have reviewed the patients History and Physical, chart, labs and discussed the procedure including the risks, benefits and alternatives for the proposed anesthesia with the patient or authorized representative who has indicated his/her understanding and acceptance.     Plan Discussed with:   Anesthesia Plan Comments:         Anesthesia Quick Evaluation

## 2012-02-11 NOTE — Transfer of Care (Signed)
Immediate Anesthesia Transfer of Care Note  Patient: Austin Diaz  Procedure(s) Performed: Procedure(s) (LRB) with comments: CATARACT EXTRACTION PHACO AND INTRAOCULAR LENS PLACEMENT (IOC) (Left) - CDE:11.27  Patient Location: Short Stay  Anesthesia Type: MAC  Level of Consciousness: awake, alert  and oriented  Airway & Oxygen Therapy: Patient Spontanous Breathing  Post-op Assessment: Report given to PACU RN  Post vital signs: Reviewed and stable  Complications: No apparent anesthesia complications

## 2012-02-11 NOTE — Anesthesia Postprocedure Evaluation (Signed)
  Anesthesia Post-op Note  Patient: Austin Diaz  Procedure(s) Performed: Procedure(s) (LRB) with comments: CATARACT EXTRACTION PHACO AND INTRAOCULAR LENS PLACEMENT (IOC) (Left) - CDE:11.27  Patient Location: Short Stay  Anesthesia Type: MAC  Level of Consciousness: awake, alert  and oriented  Airway and Oxygen Therapy: Patient Spontanous Breathing  Post-op Pain: none  Post-op Assessment: Post-op Vital signs reviewed, [name] Cardiovascular Status Stable, Respiratory Function Stable, Patent Airway, No signs of Nausea or vomiting and Pain level controlled  Post-op Vital Signs: Reviewed and stable  Complications: No apparent anesthesia complications

## 2012-02-11 NOTE — H&P (Signed)
I have reviewed the pre printed H&P, the patient was re-examined, and I have identified no significant interval changes in the patient's medical condition.  There is no change in the plan of care since the history and physical of record. 

## 2012-02-11 NOTE — Preoperative (Signed)
Beta Blockers   Reason not to administer Beta Blockers:Not Applicable 

## 2012-02-11 NOTE — Op Note (Signed)
See scanned op note dated today  

## 2012-02-13 ENCOUNTER — Encounter (HOSPITAL_COMMUNITY): Payer: Self-pay | Admitting: Ophthalmology

## 2012-03-17 ENCOUNTER — Ambulatory Visit: Payer: Medicare Other | Admitting: Cardiovascular Disease

## 2012-03-17 ENCOUNTER — Ambulatory Visit (INDEPENDENT_AMBULATORY_CARE_PROVIDER_SITE_OTHER): Payer: Medicare Other | Admitting: Cardiovascular Disease

## 2012-03-17 ENCOUNTER — Encounter: Payer: Self-pay | Admitting: Cardiovascular Disease

## 2012-03-17 VITALS — BP 134/68 | HR 64 | Ht 69.0 in | Wt 209.5 lb

## 2012-03-17 DIAGNOSIS — E785 Hyperlipidemia, unspecified: Secondary | ICD-10-CM

## 2012-03-17 DIAGNOSIS — Z9889 Other specified postprocedural states: Secondary | ICD-10-CM

## 2012-03-17 DIAGNOSIS — I10 Essential (primary) hypertension: Secondary | ICD-10-CM

## 2012-03-17 DIAGNOSIS — I2510 Atherosclerotic heart disease of native coronary artery without angina pectoris: Secondary | ICD-10-CM

## 2012-03-17 MED ORDER — ATORVASTATIN CALCIUM 40 MG PO TABS: 40.0000 mg | ORAL_TABLET | Freq: Every day | ORAL | Status: DC

## 2012-03-17 NOTE — Patient Instructions (Addendum)
Start Atorvastatin 40 mg at bedtime for high cholesterol.  Fasting labs in 6 weeks.  Follow up in 6 months.

## 2012-03-18 ENCOUNTER — Encounter: Payer: Self-pay | Admitting: Cardiovascular Disease

## 2012-03-18 ENCOUNTER — Other Ambulatory Visit: Payer: Self-pay

## 2012-03-18 MED ORDER — NITROGLYCERIN 0.4 MG SL SUBL: 0.4000 mg | SUBLINGUAL_TABLET | SUBLINGUAL | Status: DC | PRN

## 2012-03-18 NOTE — Progress Notes (Signed)
HPI  66 y.o. male with history of DM, HLD, tobacco abuse, HTN, CAD s/p 2V CABG 9/10 (LIMA to LAD, SVG to OM) and PAD who presents today for a followup visit. He has a history of CAD, status post CABG in 9/10 (LIMA-LAD, SVG-OM). LV function was normal at 65% by cardiac catheterization and normal by echo April 2012. He underwent knee replacement early this year without complications. He had a Myoview before that which showed small and mild anteroseptal ischemia as well as basal inferolateral ischemia. Ejection fraction was normal. He was overall asymptomatic and no intervention was needed. He has a history of PAD. He underwent right to left fem-fem bypass grafting and left femoral to above-the-knee popliteal bypass grafting and right external iliac and common femoral artery endarterectomy in 2/12. He is also status post bilateral CEAs. He continues to smoke.  He is overall feeling well. He denies chest pain, dyspnea or claudication.   Allergies  Allergen Reactions  . Morphine Other (See Comments)    HALLUCINATIONS     Current Outpatient Prescriptions on File Prior to Visit  Medication Sig Dispense Refill  . amLODipine (NORVASC) 10 MG tablet Take 5 mg by mouth daily.      Marland Kitchen aspirin EC 325 MG tablet Take 325 mg by mouth daily.      Marland Kitchen gabapentin (NEURONTIN) 400 MG capsule Take 400 mg by mouth 2 (two) times daily.      Marland Kitchen glipiZIDE (GLUCOTROL) 5 MG 24 hr tablet Take 5 mg by mouth daily.       Marland Kitchen HYDROcodone-acetaminophen (NORCO) 10-325 MG per tablet Take 1 tablet by mouth at bedtime.      Marland Kitchen ibuprofen (ADVIL,MOTRIN) 200 MG tablet Take 600 mg by mouth every morning. And 400 mg at night      . lisinopril (PRINIVIL,ZESTRIL) 20 MG tablet Take 40 mg by mouth daily.       . metoprolol succinate (TOPROL-XL) 100 MG 24 hr tablet Take 50 mg by mouth every morning. Take with or immediately following a meal.      . terazosin (HYTRIN) 2 MG capsule Take 4 mg by mouth at bedtime.      . vitamin B-12  (CYANOCOBALAMIN) 500 MCG tablet Take 500 mcg by mouth daily.      . [DISCONTINUED] nitroGLYCERIN (NITROSTAT) 0.4 MG SL tablet Place 0.4 mg under the tongue every 5 (five) minutes as needed.       Marland Kitchen atorvastatin (LIPITOR) 40 MG tablet Take 1 tablet (40 mg total) by mouth daily.  30 tablet  6     Past Medical History  Diagnosis Date  . History of carotid endarterectomy     bilateral  . PVD (peripheral vascular disease)     noninvasive studies 10/2009 with ABI 0.35 on left and 0.70 on right  . SOB (shortness of breath)   . Hypertension     unspecified   . Nonfunctioning kidney     right kidney  . Tobacco abuse   . Diabetes mellitus   . Hyperlipidemia   . History of non-ST elevation myocardial infarction (NSTEMI) SEPT 2010  . S/P CABG x 2   . S/P femoral-popliteal bypass surgery   . Acute medial meniscus tear of left knee   . Arthritis   . Coronary artery disease CARDIOLOGIST- DR Carlei Huang -- LAST  VISIT 06-01-11  IN EPIC---   PT DENIES S & S    s/p 2 v CABG 01/2009.  LIMA to LAD, SVG to OM  .  PAD (peripheral artery disease)   . Claudication in peripheral vascular disease   . Acute medial meniscal injury of knee LEFT  . Anxiety   . Depression      Past Surgical History  Procedure Date  . Bilateral carotid endartectomy RIGHT 1999/   LEFT 1995  . Left hip replacement     x3  LAST ONE 2003  . Right hip replacement     x2  . Right to left fem-fem bypass graft/ left fem-pop bypass graft/ right external iliac & common femoral endarterectomy 05-31-2010  . Cardiac catheterization 09- 24 & 27- 2010    HIGH-GRADE LEFT MAIN AND 2 VESSEL CAD,  S/P NSTEMI  . Renal artery angioplasty 05-26-2010    RIGHT COMMMON ILIAC ARTERY  . Lumbar disc surgery YRS AGO  . Transthoracic echocardiogram 08-16-2010    NORMAL LVSF/ EF 55-60%/ MODERATELY DILATED LEFT ATRIUM  . Lexiscan myoview 05-28-2011  DR Saleha Kalp ([facility] [place])    SMALL DISTAL ANTEROSEPTAL REVERSIBLE DEFECT WITH NORMAL EF 60%./  NO  LARGE AREAS OF ISCHEMIA OR HIGH RISK FEATURES.  . Cataract extraction w/ intraocular lens implant     RIGHT  . Knee surgery Feb. 2013    Left knee  . Coronary artery bypass graft 02-03-2009    X2- VESSEL CABG;  LIMA to LAD, SVG to OM  . Cataract extraction w/phaco 02/11/2012    Procedure: CATARACT EXTRACTION PHACO AND INTRAOCULAR LENS PLACEMENT (IOC);  Surgeon: Susa Simmonds, MD;  Location: [facility];  Service: Ophthalmology;  Laterality: Left;  CDE:11.27  . Cataract extraction     left     Family History  Problem Relation Age of Onset  . Osteoporosis Mother   . Stroke Mother      History   Social History  . Marital Status: Widowed    Spouse Name: N/A    Number of Children: N/A  . Years of Education: N/A   Occupational History  . Not on file.   Social History Main Topics  . Smoking status: Current Every Day Smoker -- 1.0 packs/day for 50 years    Types: Cigarettes  . Smokeless tobacco: Never Used     Comment: PT HAS CUT BACK FROM 2 PPD TO 1 PPD SINCE JAN 2012  . Alcohol Use: 2.4 oz/week    4 Shots of liquor per week  . Drug Use: No  . Sexually Active: Yes    Birth Control/ Protection: None   Other Topics Concern  . Not on file   Social History Narrative  . No narrative on file     PHYSICAL EXAM   BP 134/68  Pulse 64  Ht 5\' 9"  (1.753 m)  Wt 209 lb 8 oz (95.029 kg)  BMI 30.94 kg/m2 Constitutional: He is oriented to person, place, and time. He appears well-developed and well-nourished. No distress.  HENT: No nasal discharge.  Head: Normocephalic and atraumatic.  Eyes: Pupils are equal and round. Right eye exhibits no discharge. Left eye exhibits no discharge.  Neck: Normal range of motion. Neck supple. No JVD present. No thyromegaly present. Left carotid bruit. Cardiovascular: Normal rate, regular rhythm, normal heart sounds and. Exam reveals no gallop and no friction rub. 1/6 systolic ejection murmur at the aortic area.  Pulmonary/Chest: Effort normal and  breath sounds normal. No stridor. No respiratory distress. He has no wheezes. He has no rales. He exhibits no tenderness.  Abdominal: Soft. Bowel sounds are normal. He exhibits no distension. There is no tenderness.  There is no rebound and no guarding.  Musculoskeletal: Normal range of motion. He exhibits no edema and no tenderness.  Neurological: He is alert and oriented to person, place, and time. Coordination normal.  Skin: Skin is warm and dry. No rash noted. He is not diaphoretic. No erythema. No pallor.  Psychiatric: He has a normal mood and affect. His behavior is normal. Judgment and thought content normal.       ASSESSMENT AND PLAN

## 2012-03-18 NOTE — Assessment & Plan Note (Signed)
The patient seems to be doing reasonably well from a cardiac standpoint. He has no symptoms suggestive of angina. Continue medical therapy. He brought me all his medical records from the [place] to review. He wants my opinion whether his myocardial infarction was caused by high blood pressure related to stopping his blood pressure medications in June of 2010. He had his myocardial infarction in September of 2010. I reviewed his records. It appears that in June of 2010 he presented with weight loss, loss of interest and possible depression. He was noted to be tachycardic and hypotensive. Thus, his blood pressure medications were stopped and he was asked according to the note to return in 3 days for a nurse blood pressure check. It appears that the followup did not happen. I explained to him during the visit, that myocardial infarction is a complex illness that has multiple causes. Atherosclerosis is the primary reason for this. Occasionally, very elevated blood pressure can trigger myocardial infarction. However, I cannot be certain that his elevated blood pressure caused his myocardial infarction or vice versa.

## 2012-03-18 NOTE — Assessment & Plan Note (Signed)
The patient has diffuse atherosclerosis and there is a strong indication for a statin. He used to be on pravastatin but he is not taking the medication. I recommend starting atorvastatin 40 mg once daily. I requested a followup lipid and liver profile to be done in 6 weeks.

## 2012-03-18 NOTE — Assessment & Plan Note (Signed)
Followed by Dr. Lawson. 

## 2012-03-18 NOTE — Assessment & Plan Note (Signed)
His blood pressure is reasonably controlled. 

## 2012-03-31 ENCOUNTER — Encounter: Payer: Self-pay | Admitting: Neurosurgery

## 2012-04-01 ENCOUNTER — Ambulatory Visit: Payer: Medicare Other | Admitting: Neurosurgery

## 2012-04-01 ENCOUNTER — Other Ambulatory Visit: Payer: Medicare Other

## 2012-05-19 ENCOUNTER — Encounter: Payer: Self-pay | Admitting: Neurosurgery

## 2012-05-20 ENCOUNTER — Ambulatory Visit (INDEPENDENT_AMBULATORY_CARE_PROVIDER_SITE_OTHER): Payer: Medicare Other | Admitting: Neurosurgery

## 2012-05-20 ENCOUNTER — Other Ambulatory Visit (INDEPENDENT_AMBULATORY_CARE_PROVIDER_SITE_OTHER): Payer: Medicare Other | Admitting: *Deleted

## 2012-05-20 ENCOUNTER — Encounter: Payer: Self-pay | Admitting: Neurosurgery

## 2012-05-20 ENCOUNTER — Encounter (INDEPENDENT_AMBULATORY_CARE_PROVIDER_SITE_OTHER): Payer: Medicare Other | Admitting: *Deleted

## 2012-05-20 VITALS — BP 145/64 | HR 69 | Resp 16 | Ht 69.0 in | Wt 211.0 lb

## 2012-05-20 DIAGNOSIS — Z48812 Encounter for surgical aftercare following surgery on the circulatory system: Secondary | ICD-10-CM

## 2012-05-20 DIAGNOSIS — R609 Edema, unspecified: Secondary | ICD-10-CM

## 2012-05-20 DIAGNOSIS — R200 Anesthesia of skin: Secondary | ICD-10-CM

## 2012-05-20 DIAGNOSIS — I6529 Occlusion and stenosis of unspecified carotid artery: Secondary | ICD-10-CM

## 2012-05-20 DIAGNOSIS — R209 Unspecified disturbances of skin sensation: Secondary | ICD-10-CM | POA: Diagnosis not present

## 2012-05-20 DIAGNOSIS — I70219 Atherosclerosis of native arteries of extremities with intermittent claudication, unspecified extremity: Secondary | ICD-10-CM | POA: Diagnosis not present

## 2012-05-20 DIAGNOSIS — I739 Peripheral vascular disease, unspecified: Secondary | ICD-10-CM

## 2012-05-20 NOTE — Addendum Note (Signed)
Addended by: Dannielle Karvonen on: 05/20/2012 04:19 PM   Modules accepted: Orders

## 2012-05-20 NOTE — Progress Notes (Signed)
[facility] Carotid Office Note  CC: Carotid and PAD surveillance Referring Physician: [name]  History of Present Illness: 67 year old patient of Dr. [name] who is status post a right to left fem-fem bypass and a left femoral to above-the-knee popliteal bypass in January 2012. The patient also has a remote history of a right CEA in 1999 and a left CEA in 1995. The patient denies any signs or symptoms of CVA, TIA, amaurosis fugax or any neural deficit. The patient does deny claudication or rest pain although he does have some left knee and ankle pain which is probably arthritic in etiology.  Past Medical History  Diagnosis Date  . History of carotid endarterectomy     bilateral  . PVD (peripheral vascular disease)     noninvasive studies 10/2009 with ABI 0.35 on left and 0.70 on right  . SOB (shortness of breath)   . Hypertension     unspecified   . Nonfunctioning kidney     right kidney  . Tobacco abuse   . Diabetes mellitus   . Hyperlipidemia   . History of non-ST elevation myocardial infarction (NSTEMI) SEPT 2010  . S/P CABG x 2   . S/P femoral-popliteal bypass surgery   . Acute medial meniscus tear of left knee   . Arthritis   . Coronary artery disease CARDIOLOGIST- DR ARIDA -- LAST  VISIT 06-01-11  IN EPIC---   PT DENIES S & S    s/p 2 v CABG 01/2009.  LIMA to LAD, SVG to OM  . PAD (peripheral artery disease)   . Claudication in peripheral vascular disease   . Acute medial meniscal injury of knee LEFT  . Anxiety   . Depression     ROS: [x]  Positive   [ ]  Denies    General: [ ]  Weight loss, [ ]  Fever, [ ]  chills Neurologic: [ ]  Dizziness, [ ]  Blackouts, [ ]  Seizure [ ]  Stroke, [ ]  "Mini stroke", [ ]  Slurred speech, [ ]  Temporary blindness; [ ]  weakness in arms or legs, [ ]  Hoarseness Cardiac: [ ]  Chest pain/pressure, [ ]  Shortness of breath at rest [ ]  Shortness of breath with exertion, [ ]  Atrial fibrillation or irregular heartbeat Vascular: [  ] Pain in legs with walking, [ ]  Pain in legs at rest, [ ]  Pain in legs at night,  [ ]  Non-healing ulcer, [ ]  Blood clot in vein/DVT,   Pulmonary: [ ]  Home oxygen, [ ]  Productive cough, [ ]  Coughing up blood, [ ]  Asthma,  [ ]  Wheezing Musculoskeletal:  [ ]  Arthritis, [ ]  Low back pain, [ ]  Joint pain Hematologic: [ ]  Easy Bruising, [ ]  Anemia; [ ]  Hepatitis Gastrointestinal: [ ]  Blood in stool, [ ]  Gastroesophageal Reflux/heartburn, [ ]  Trouble swallowing Urinary: [ ]  chronic Kidney disease, [ ]  on HD - [ ]  MWF or [ ]  TTHS, [ ]  Burning with urination, [ ]  Difficulty urinating Skin: [ ]  Rashes, [ ]  Wounds Psychological: [ ]  Anxiety, [ ]  Depression   Social History History  Substance Use Topics  . Smoking status: Current Every Day Smoker -- 1.0 packs/day for 50 years    Types: Cigarettes  . Smokeless tobacco: Never Used     Comment: PT HAS CUT BACK FROM 2 PPD TO 1 PPD SINCE JAN 2012  . Alcohol Use: 2.4 oz/week    4 Shots of liquor per week    Family History Family History  Problem Relation Age of Onset  .  Osteoporosis Mother   . Stroke Mother     Allergies  Allergen Reactions  . Morphine Other (See Comments)    HALLUCINATIONS    Current Outpatient Prescriptions  Medication Sig Dispense Refill  . amLODipine (NORVASC) 10 MG tablet Take 5 mg by mouth daily.      Marland Kitchen aspirin EC 325 MG tablet Take 325 mg by mouth daily.      Marland Kitchen atorvastatin (LIPITOR) 40 MG tablet Take 1 tablet (40 mg total) by mouth daily.  30 tablet  6  . gabapentin (NEURONTIN) 400 MG capsule Take 400 mg by mouth 2 (two) times daily.      Marland Kitchen glipiZIDE (GLUCOTROL) 5 MG 24 hr tablet Take 5 mg by mouth daily.       Marland Kitchen HYDROcodone-acetaminophen (NORCO) 10-325 MG per tablet Take 1 tablet by mouth at bedtime.      Marland Kitchen ibuprofen (ADVIL,MOTRIN) 200 MG tablet Take 600 mg by mouth every morning. And 400 mg at night      . lisinopril (PRINIVIL,ZESTRIL) 20 MG tablet Take 40 mg by mouth daily.       . metoprolol succinate  (TOPROL-XL) 100 MG 24 hr tablet Take 50 mg by mouth every morning. Take with or immediately following a meal.      . nitroGLYCERIN (NITROSTAT) 0.4 MG SL tablet Place 1 tablet (0.4 mg total) under the tongue every 5 (five) minutes as needed.  25 tablet  6  . terazosin (HYTRIN) 2 MG capsule Take 4 mg by mouth at bedtime.      . vitamin B-12 (CYANOCOBALAMIN) 500 MCG tablet Take 500 mcg by mouth daily.        Physical Examination  Filed Vitals:   05/20/12 1525  BP: 145/64  Pulse: 69  Resp:     Body mass index is 31.16 kg/(m^2).  General:  WDWN in NAD Gait: Normal HEENT: WNL Eyes: Pupils equal Pulmonary: normal non-labored breathing , without Rales, rhonchi,  wheezing Cardiac: RRR, without  Murmurs, rubs or gallops; Abdomen: soft, NT, no masses Skin: no rashes, ulcers noted  Vascular Exam Pulses: 3+ radial pulses bilaterally dampened femoral pulses are palpable PT and DP pulse bilaterally although he is dampened on the right. Carotid bruits: Carotid pulses heard on the right with a known left occlusion Extremities without ischemic changes, no Gangrene , no cellulitis; no open wounds;  Musculoskeletal: no muscle wasting or atrophy   Neurologic: A&O X 3; Appropriate Affect ; SENSATION: normal; MOTOR FUNCTION:  moving all extremities equally. Speech is fluent/normal  Non-Invasive Vascular Imaging CAROTID DUPLEX 05/20/2012  Right ICA 20 - 39 % stenosis Left ICA 0ccluded stenosis ABIs today show a patent left fem-fem bypass and a right femoral pop bypass with no hemodynamically significant stenosis. ABIs today are 0.64 biphasic on the right, 0.84 and monophasic to biphasic on the left which is a slight decrease bilaterally from previous exam.  ASSESSMENT/PLAN: Asymptomatic patient will followup in one year with repeat carotid duplex and in 6 months with repeat bypass duplex and ABIs. The patient knows to call our office if he develops claudication or rest pain in the interim. The  patient's questions were encouraged and answered, he is in agreement with this plan.  Lauree Chandler ANP    Clinic MD : [name]

## 2012-10-30 ENCOUNTER — Encounter (INDEPENDENT_AMBULATORY_CARE_PROVIDER_SITE_OTHER): Payer: Medicare Other | Admitting: *Deleted

## 2012-10-30 DIAGNOSIS — I70219 Atherosclerosis of native arteries of extremities with intermittent claudication, unspecified extremity: Secondary | ICD-10-CM

## 2012-10-30 DIAGNOSIS — R609 Edema, unspecified: Secondary | ICD-10-CM

## 2012-10-30 DIAGNOSIS — R200 Anesthesia of skin: Secondary | ICD-10-CM

## 2012-10-30 DIAGNOSIS — Z48812 Encounter for surgical aftercare following surgery on the circulatory system: Secondary | ICD-10-CM

## 2012-10-30 DIAGNOSIS — I6529 Occlusion and stenosis of unspecified carotid artery: Secondary | ICD-10-CM

## 2012-10-30 DIAGNOSIS — I739 Peripheral vascular disease, unspecified: Secondary | ICD-10-CM | POA: Diagnosis not present

## 2012-11-04 ENCOUNTER — Other Ambulatory Visit: Payer: Self-pay | Admitting: *Deleted

## 2012-11-04 DIAGNOSIS — Z48812 Encounter for surgical aftercare following surgery on the circulatory system: Secondary | ICD-10-CM

## 2012-11-04 DIAGNOSIS — I739 Peripheral vascular disease, unspecified: Secondary | ICD-10-CM

## 2012-11-06 ENCOUNTER — Encounter: Payer: Self-pay | Admitting: Vascular Surgery

## 2012-11-18 ENCOUNTER — Ambulatory Visit: Payer: Medicare Other | Admitting: Neurosurgery

## 2012-12-08 ENCOUNTER — Encounter: Payer: Self-pay | Admitting: Cardiovascular Disease

## 2012-12-08 ENCOUNTER — Ambulatory Visit (INDEPENDENT_AMBULATORY_CARE_PROVIDER_SITE_OTHER): Payer: Medicare Other | Admitting: Cardiovascular Disease

## 2012-12-08 VITALS — BP 130/80 | HR 63 | Ht 68.0 in | Wt 213.5 lb

## 2012-12-08 DIAGNOSIS — Z9889 Other specified postprocedural states: Secondary | ICD-10-CM

## 2012-12-08 DIAGNOSIS — Z01810 Encounter for preprocedural cardiovascular examination: Secondary | ICD-10-CM | POA: Diagnosis not present

## 2012-12-08 DIAGNOSIS — I2510 Atherosclerotic heart disease of native coronary artery without angina pectoris: Secondary | ICD-10-CM

## 2012-12-08 NOTE — Patient Instructions (Addendum)
You are at moderate risk for planned knee surgery.  Continue same medications.  Follow up in 6 months.

## 2012-12-08 NOTE — Assessment & Plan Note (Signed)
Followed by vascular surgery. 

## 2012-12-08 NOTE — Assessment & Plan Note (Signed)
He is known to have coronary artery disease with previous CABG. Stress test in 2013 was mildly abnormal. However, his ejection fraction was normal and he has no anginal symptoms. His current EKG is within normal limits with no ischemic changes. His functional capacity is limited by arthritis but overall not poor (above 4 METS). Based on that, he should be considered at an overall moderate risk for cardiovascular complications. I don't feel that repeating a stress test would change his management.

## 2012-12-08 NOTE — Progress Notes (Signed)
HPI  67 y.o. male with history of DM, HLD, tobacco abuse, HTN, CAD s/p 2V CABG 9/10 (LIMA to LAD, SVG to OM) and PAD who presents today for a followup visit. He has a history of CAD, status post CABG in 9/10 (LIMA-LAD, SVG-OM). LV function was normal at 65% by cardiac catheterization and normal by echo April 2012. He underwent knee replacement in 2013 without complications. He had a Myoview before that which showed small and mild anteroseptal ischemia as well as basal inferolateral ischemia. Ejection fraction was normal. He was overall asymptomatic and no intervention was needed. He has a history of PAD. He underwent right to left fem-fem bypass grafting and left femoral to above-the-knee popliteal bypass grafting and right external iliac and common femoral artery endarterectomy in 2/12. He is also status post bilateral CEAs. He continues to smoke.  He is overall feeling well. He denies chest pain, dyspnea or claudication. He needs left knee surgery. His functional capacity is limited by arthritis but he has no anginal symptoms. He also has an elevated PSA and might need a prostate biopsy.   Allergies  Allergen Reactions  . Morphine Other (See Comments)    HALLUCINATIONS     Current Outpatient Prescriptions on File Prior to Visit  Medication Sig Dispense Refill  . aspirin EC 325 MG tablet Take 325 mg by mouth daily.      Marland Kitchen gabapentin (NEURONTIN) 400 MG capsule Take 400 mg by mouth 2 (two) times daily.      Marland Kitchen glipiZIDE (GLUCOTROL) 5 MG 24 hr tablet Take 5 mg by mouth daily.       Marland Kitchen HYDROcodone-acetaminophen (NORCO) 10-325 MG per tablet Take 1 tablet by mouth at bedtime.      Marland Kitchen ibuprofen (ADVIL,MOTRIN) 200 MG tablet Take 600 mg by mouth as needed. And 400 mg at night      . metoprolol succinate (TOPROL-XL) 100 MG 24 hr tablet Take 50 mg by mouth every morning. Take with or immediately following a meal.      . nitroGLYCERIN (NITROSTAT) 0.4 MG SL tablet Place 1 tablet (0.4 mg total) under the  tongue every 5 (five) minutes as needed.  25 tablet  6  . terazosin (HYTRIN) 2 MG capsule Take 4 mg by mouth at bedtime.       No current facility-administered medications on file prior to visit.     Past Medical History  Diagnosis Date  . History of carotid endarterectomy     bilateral  . PVD (peripheral vascular disease)     noninvasive studies 10/2009 with ABI 0.35 on left and 0.70 on right  . SOB (shortness of breath)   . Hypertension     unspecified   . Nonfunctioning kidney     right kidney  . Tobacco abuse   . Diabetes mellitus   . Hyperlipidemia   . History of non-ST elevation myocardial infarction (NSTEMI) SEPT 2010  . S/P CABG x 2   . S/P femoral-popliteal bypass surgery   . Acute medial meniscus tear of left knee   . Arthritis   . Coronary artery disease CARDIOLOGIST- DR Ayda Tancredi -- LAST  VISIT 06-01-11  IN EPIC---   PT DENIES S & S    s/p 2 v CABG 01/2009.  LIMA to LAD, SVG to OM  . PAD (peripheral artery disease)   . Claudication in peripheral vascular disease   . Acute medial meniscal injury of knee LEFT  . Anxiety   . Depression  Past Surgical History  Procedure Laterality Date  . Bilateral carotid endartectomy  RIGHT 1999/   LEFT 1995  . Left hip replacement      x3  LAST ONE 2003  . Right hip replacement      x2  . Right to left fem-fem bypass graft/ left fem-pop bypass graft/ right external iliac & common femoral endarterectomy  05-31-2010  . Cardiac catheterization  09- 24 & 27- 2010    HIGH-GRADE LEFT MAIN AND 2 VESSEL CAD,  S/P NSTEMI  . Renal artery angioplasty  05-26-2010    RIGHT COMMMON ILIAC ARTERY  . Lumbar disc surgery  YRS AGO  . Transthoracic echocardiogram  08-16-2010    NORMAL LVSF/ EF 55-60%/ MODERATELY DILATED LEFT ATRIUM  . Lexiscan myoview  05-28-2011  DR Kanna Dafoe ( [facility] , [place])    SMALL DISTAL ANTEROSEPTAL REVERSIBLE DEFECT WITH NORMAL EF 60%./  NO LARGE AREAS OF ISCHEMIA OR HIGH RISK FEATURES.  . Cataract extraction w/  intraocular lens implant      RIGHT  . Knee surgery  Feb. 2013    Left knee  . Coronary artery bypass graft  02-03-2009    X2- VESSEL CABG;  LIMA to LAD, SVG to OM  . Cataract extraction w/phaco  02/11/2012    Procedure: CATARACT EXTRACTION PHACO AND INTRAOCULAR LENS PLACEMENT (IOC);  Surgeon: Susa Simmonds, MD;  Location: [facility];  Service: Ophthalmology;  Laterality: Left;  CDE:11.27  . Cataract extraction      left     Family History  Problem Relation Age of Onset  . Osteoporosis Mother   . Stroke Mother      History   Social History  . Marital Status: Widowed    Spouse Name: N/A    Number of Children: N/A  . Years of Education: N/A   Occupational History  . Not on file.   Social History Main Topics  . Smoking status: Current Every Day Smoker -- 1.00 packs/day for 50 years    Types: Cigarettes  . Smokeless tobacco: Never Used     Comment: PT HAS CUT BACK FROM 2 PPD TO 1 PPD SINCE JAN 2012  . Alcohol Use: 2.4 oz/week    4 Shots of liquor per week  . Drug Use: No  . Sexually Active: Yes    Birth Control/ Protection: None   Other Topics Concern  . Not on file   Social History Narrative  . No narrative on file     PHYSICAL EXAM   BP 130/80  Pulse 63  Ht 5\' 8"  (1.727 m)  Wt 213 lb 8 oz (96.843 kg)  BMI 32.47 kg/m2 Constitutional: He is oriented to person, place, and time. He appears well-developed and well-nourished. No distress.  HENT: No nasal discharge.  Head: Normocephalic and atraumatic.  Eyes: Pupils are equal and round. Right eye exhibits no discharge. Left eye exhibits no discharge.  Neck: Normal range of motion. Neck supple. No JVD present. No thyromegaly present. Left carotid bruit. Cardiovascular: Normal rate, regular rhythm, normal heart sounds and. Exam reveals no gallop and no friction rub. 1/6 systolic ejection murmur at the aortic area.  Pulmonary/Chest: Effort normal and breath sounds normal. No stridor. No respiratory distress. He has  no wheezes. He has no rales. He exhibits no tenderness.  Abdominal: Soft. Bowel sounds are normal. He exhibits no distension. There is no tenderness. There is no rebound and no guarding.  Musculoskeletal: Normal range of motion. He exhibits no edema and no  tenderness.  Neurological: He is alert and oriented to person, place, and time. Coordination normal.  Skin: Skin is warm and dry. No rash noted. He is not diaphoretic. No erythema. No pallor.  Psychiatric: He has a normal mood and affect. His behavior is normal. Judgment and thought content normal.     EKG: Sinus  Rhythm  WITHIN NORMAL LIMITS   ASSESSMENT AND PLAN

## 2012-12-08 NOTE — Assessment & Plan Note (Signed)
No symptoms of angina. Continue medical therapy. 

## 2013-05-25 ENCOUNTER — Encounter: Payer: Self-pay | Admitting: Vascular Surgery

## 2013-05-26 ENCOUNTER — Inpatient Hospital Stay (HOSPITAL_COMMUNITY): Admission: RE | Admit: 2013-05-26 | Payer: Medicare Other | Source: Ambulatory Visit

## 2013-05-26 ENCOUNTER — Ambulatory Visit: Payer: Medicare Other | Admitting: Family

## 2013-06-09 ENCOUNTER — Ambulatory Visit (INDEPENDENT_AMBULATORY_CARE_PROVIDER_SITE_OTHER): Payer: Medicare Other | Admitting: Cardiology

## 2013-06-09 ENCOUNTER — Encounter: Payer: Self-pay | Admitting: Cardiology

## 2013-06-09 VITALS — BP 137/85 | HR 78 | Ht 68.0 in | Wt 212.8 lb

## 2013-06-09 DIAGNOSIS — I2510 Atherosclerotic heart disease of native coronary artery without angina pectoris: Secondary | ICD-10-CM

## 2013-06-09 DIAGNOSIS — I4892 Unspecified atrial flutter: Secondary | ICD-10-CM | POA: Diagnosis not present

## 2013-06-09 MED ORDER — APIXABAN 5 MG PO TABS: 5.0000 mg | ORAL_TABLET | Freq: Two times a day (BID) | ORAL | Status: DC

## 2013-06-09 MED ORDER — ATORVASTATIN CALCIUM 40 MG PO TABS: 40.0000 mg | ORAL_TABLET | Freq: Every day | ORAL | Status: DC

## 2013-06-09 MED ORDER — NITROGLYCERIN 0.4 MG SL SUBL: 0.4000 mg | SUBLINGUAL_TABLET | SUBLINGUAL | Status: AC | PRN

## 2013-06-09 NOTE — Patient Instructions (Signed)
Your physician recommends that you schedule a follow-up appointment in: 2 months with Dr. Wyline MoodBranch. This appointment will be scheduled today before you leave.  Your physician has recommended you make the following change in your medication:  Start: Eliquis 5 MG one tablet by mouth twice daily. You have been given some samples today. Start: Lipitor (Atorvastatin) 40 MG one tablet by mouth once daily. Decrease Aspirin 81 MG one tablet by mouth once daily.  Continue all other medications the same.

## 2013-06-09 NOTE — Progress Notes (Signed)
Clinical Summary Austin Diaz is a 68 y.o.male last seen by Austin Diaz, this is our first visit together. He was seen for the following medical problems.   1. CAD - prior 2 vessel CABG in 01/2009 (LIMA-LAD, SVG-OM) - LVEF 55-60% by echo 08/2010 - MPI Jan 2013 with mild ischemia in the mid to apical anteroseptal wall and basal inferolateral wall.  - no chest pain. No SOB or DOE. Fairly sedentary lifestyle because of chronic leg pain. - compliant with meds:   2. PAD - followed by vacsular  3. Diabetes - diet controlled, followed by PCP - reports he was on lisnopril prevoiusly, stopped for unclear reason  4. Atrial flutter - denies any palpitations - he is on metoprolol previously - CHADS2Vasc score is 4, he is on eliquis Past Medical History  Diagnosis Date  . History of carotid endarterectomy     bilateral  . PVD (peripheral vascular disease)     noninvasive studies 10/2009 with ABI 0.35 on left and 0.70 on right  . SOB (shortness of breath)   . Hypertension     unspecified   . Nonfunctioning kidney     right kidney  . Tobacco abuse   . Diabetes mellitus   . Hyperlipidemia   . History of non-ST elevation myocardial infarction (NSTEMI) SEPT 2010  . S/P CABG x 2   . S/P femoral-popliteal bypass surgery   . Acute medial meniscus tear of left knee   . Arthritis   . Coronary artery disease CARDIOLOGIST- Austin Diaz -- LAST  VISIT 06-01-11  IN EPIC---   PT DENIES S & S    s/p 2 v CABG 01/2009.  LIMA to LAD, SVG to OM  . PAD (peripheral artery disease)   . Claudication in peripheral vascular disease   . Acute medial meniscal injury of knee LEFT  . Anxiety   . Depression      Allergies  Allergen Reactions  . Morphine Other (See Comments)    HALLUCINATIONS     Current Outpatient Prescriptions  Medication Sig Dispense Refill  . aspirin EC 325 MG tablet Take 325 mg by mouth daily.      Marland Kitchen gabapentin (NEURONTIN) 400 MG capsule Take 400 mg by mouth 2 (two) times daily.        Marland Kitchen glipiZIDE (GLUCOTROL) 5 MG 24 hr tablet Take 5 mg by mouth daily.       Marland Kitchen HYDROcodone-acetaminophen (NORCO) 10-325 MG per tablet Take 1 tablet by mouth at bedtime.      Marland Kitchen ibuprofen (ADVIL,MOTRIN) 200 MG tablet Take 600 mg by mouth as needed. And 400 mg at night      . metoprolol succinate (TOPROL-XL) 100 MG 24 hr tablet Take 50 mg by mouth every morning. Take with or immediately following a meal.      . nitroGLYCERIN (NITROSTAT) 0.4 MG SL tablet Place 1 tablet (0.4 mg total) under the tongue every 5 (five) minutes as needed.  25 tablet  6  . terazosin (HYTRIN) 2 MG capsule Take 4 mg by mouth at bedtime.       No current facility-administered medications for this visit.     Past Surgical History  Procedure Laterality Date  . Bilateral carotid endartectomy  RIGHT 1999/   LEFT 1995  . Left hip replacement      x3  LAST ONE 2003  . Right hip replacement      x2  . Right to left fem-fem bypass graft/ left fem-pop  bypass graft/ right external iliac & common femoral endarterectomy  05-31-2010  . Cardiac catheterization  09- 24 & 27- 2010    HIGH-GRADE LEFT MAIN AND 2 VESSEL CAD,  S/P NSTEMI  . Renal artery angioplasty  05-26-2010    RIGHT COMMMON ILIAC ARTERY  . Lumbar disc surgery  YRS AGO  . Transthoracic echocardiogram  08-16-2010    NORMAL LVSF/ EF 55-60%/ MODERATELY DILATED LEFT ATRIUM  . Lexiscan myoview  05-28-2011  Austin Diaz ([place] [facility] [place])    SMALL DISTAL ANTEROSEPTAL REVERSIBLE DEFECT WITH NORMAL EF 60%./  NO LARGE AREAS OF ISCHEMIA OR HIGH RISK FEATURES.  . Cataract extraction w/ intraocular lens implant      RIGHT  . Knee surgery  Feb. 2013    Left knee  . Coronary artery bypass graft  02-03-2009    X2- VESSEL CABG;  LIMA to LAD, SVG to OM  . Cataract extraction w/phaco  02/11/2012    Procedure: CATARACT EXTRACTION PHACO AND INTRAOCULAR LENS PLACEMENT (IOC);  Surgeon: Austin Simmondsarroll F Haines, MD;  Location: [facility];  Service: Ophthalmology;  Laterality: Left;  CDE:11.27  .  Cataract extraction      left     Allergies  Allergen Reactions  . Morphine Other (See Comments)    HALLUCINATIONS      Family History  Problem Relation Age of Onset  . Osteoporosis Mother   . Stroke Mother      Social History Mr. Austin Diaz reports that he has been smoking Cigarettes.  He has a 50 pack-year smoking history. He has never used smokeless tobacco. Mr. Austin Diaz reports that he drinks about 2.4 ounces of alcohol per week.   Review of Systems CONSTITUTIONAL: No weight loss, fever, chills, weakness or fatigue.  HEENT: Eyes: No visual loss, blurred vision, double vision or yellow sclerae.No hearing loss, sneezing, congestion, runny nose or sore throat.  SKIN: No rash or itching.  CARDIOVASCULAR: per HPI RESPIRATORY: No shortness of breath, cough or sputum.  GASTROINTESTINAL: No anorexia, nausea, vomiting or diarrhea. No abdominal pain or blood.  GENITOURINARY: No burning on urination, no polyuria NEUROLOGICAL: No headache, dizziness, syncope, paralysis, ataxia, numbness or tingling in the extremities. No change in bowel or bladder control.  MUSCULOSKELETAL: No muscle, back pain, joint pain or stiffness.  LYMPHATICS: No enlarged nodes. No history of splenectomy.  PSYCHIATRIC: No history of depression or anxiety.  ENDOCRINOLOGIC: No reports of sweating, cold or heat intolerance. No polyuria or polydipsia.  Marland Kitchen.   Physical Examination  Gen: resting comfortably, no acute distress HEENT: no scleral icterus, pupils equal round and reactive, no palptable cervical adenopathy,  CV: irreg, no m/r/g, no JVD Resp: Clear to auscultation bilaterally GI: abdomen is soft, non-tender, non-distended, normal bowel sounds, no hepatosplenomegaly MSK: extremities are warm, no edema.  Skin: warm, no rash Neuro:  no focal deficits Psych: appropriate affect    Assessment and Plan  1. CAD - continue current meds and risk factor modification  2. Atrial flutter - continue current  meds, continue anticoagulation  3. PAD - no current symptoms, continue risk factor modification    Austin Diaz, M.D., F.A.C.C.

## 2013-07-13 ENCOUNTER — Telehealth: Payer: Self-pay | Admitting: Cardiology

## 2013-07-13 MED ORDER — APIXABAN 5 MG PO TABS: 5.0000 mg | ORAL_TABLET | Freq: Two times a day (BID) | ORAL | Status: DC

## 2013-07-13 NOTE — Telephone Encounter (Signed)
Patient was seen by Dr. Wyline MoodBranch on 07/07/13 and was given samples of Eliquis along with a prescription.  He cannot afford to have the prescription filled.  Please advise.

## 2013-07-13 NOTE — Telephone Encounter (Signed)
Pt does not have any insurance and [facility] gets [facility]. Pt states that prescription was going to cost him $980 for a 90 day supply and for a 1 month supply it was going to cost $300. Pt has not had any medications since Friday and has since increased his Aspirin to 325 Mg since not taking medications. [facility] Will try again on Tuesday 07-14-13 Left samples in patient pick up basket for pt to pick up.

## 2013-07-14 NOTE — Telephone Encounter (Signed)
Left message for medical release to return call.

## 2013-07-28 ENCOUNTER — Other Ambulatory Visit: Payer: Self-pay | Admitting: Cardiology

## 2013-07-28 NOTE — Telephone Encounter (Signed)
Opened in error

## 2013-07-28 NOTE — Telephone Encounter (Signed)
Informed [facility] that [facility] and that [facility] can no longer [facility] signed [facility] and [facility]. Pt informed me that [facility] on [facility]-13 and that he would come by and pick up MD office note from his last OV. Placed OV note in pt pick up basket.

## 2013-08-07 ENCOUNTER — Ambulatory Visit: Payer: Medicare Other | Admitting: Cardiology

## 2013-09-17 DIAGNOSIS — M1710 Unilateral primary osteoarthritis, unspecified knee: Secondary | ICD-10-CM | POA: Diagnosis not present

## 2013-09-24 IMAGING — NM NM MYOCAR SINGLE W/SPECT W/WALL MOTION & EF
2 series · 12 of 12 positions shown · non-contrast
Comparison: none

Ordering Physician: SALIJEVIC SABEDINI

[place] Physician: [REDACTED]al Data: 65-year-old male with history of CAD status post
CABG, PAD, hypertension, diabetes, and hyperlipidemia, referred for
preoperative evaluation prior to elective arthroscopic surgery.
NUCLEAR MEDICINE STRESS MYOVIEW STUDY WITH SPECT AND LEFT
VENTRICULAR EJECTION FRACTION
Radionuclide Data: One-day rest/stress protocol performed with
[DATE] mCi of 8c-HHm Myoview.
Stress Data: Lexiscan bolus was given in standard fashion.  Heart
rate increased from 52 beats per minute up to 68 beats per minute,
and blood pressure remained stable 138/70.  The patient tolerated
infusion well.  There were no diagnostic ST-segment changes or
arrhythmias.
EKG: Baseline ECG shows sinus bradycardia at 53 beats per minute.
Scintigraphic Data: Analysis of the raw perfusion data shows
evidence of lateral soft tissue attenuation.
Tomographic views were obtained using the short axis, vertical long
axis, and horizontal long axis planes.  There is a small, mild
intensity, defect in the anteroseptal wall from mid to basal level
that is reversible.  There is also a small, mild intensity,
inferolateral defect at the base that is reversible.
Gated imaging reveals an EDV of 124, ESV of 49, T I D ratio of
1.17, and LVEF of 60%.

[Series 1: cardiac rest stress · 6.39mm/px · 6 of 64 frames shown]
[frame 6/64]
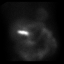
[frame 16/64]
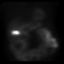
[frame 27/64]
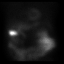
[frame 38/64]
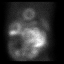
[frame 48/64]
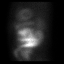
[frame 59/64]
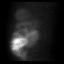

[Series 1: cs cardiac tc hi dose · 6.41mm/px · 6 of 512 frames shown]
[frame 43/512]
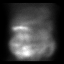
[frame 128/512]
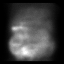
[frame 214/512]
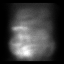
[frame 299/512]
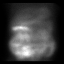
[frame 384/512]
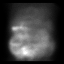
[frame 470/512]
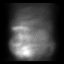

[12 of 12 positions shown; findings below may reference images not displayed]

IMPRESSION: Abnormal Lexiscan Myoview as outlined, consistent with history of
CAD.  There were no clearly diagnostic ST-segment changes or
arrhythmias.  Perfusion imaging suggests a mild degree of ischemia
affecting the mid to apical anteroseptal wall, and basal
inferolateral wall.  LVEF is normal 60%.

## 2013-10-05 ENCOUNTER — Telehealth: Payer: Self-pay | Admitting: *Deleted

## 2013-10-05 NOTE — Telephone Encounter (Signed)
Patient walked into office Friday evening inquiring about paper work for cardiac clearance.  Stated that Dr. Ophelia Charter (orthopedic) told him they had faxed [place] a note, but was waiting on our clearance for knee surgery.  Discussed issue with patient.  Informed him that we have not received said paper work for clearance.  At last OV, Dr. Wyline Mood wanted to have patient begin Eliquis.  Patient does not have prescription coverage on his insurance so he typically uses the [place] to help supply his medication.  Stated that they would not give this to him.  Stated that he started taking ASA 81mg  - 2 tabs every morning & ASA 325mg  every evening on his own.  Informed patient that he would need to be seen by provider before clearance could be given due to recent circumstances with blood thinner & discuss best treatment options for him.  OV scheduled with Dr. Tenny Craw for 10/08/13 at 2:00 in the [facility] office for cardiac clearance.  Dr. Wyline Mood out of the office this week.  Patient also asked to obtain clearance form to bring with him to this visit.  Patient verbalized understanding.

## 2013-10-08 ENCOUNTER — Encounter: Payer: Self-pay | Admitting: Internal Medicine

## 2013-10-08 ENCOUNTER — Ambulatory Visit (INDEPENDENT_AMBULATORY_CARE_PROVIDER_SITE_OTHER): Payer: Medicare Other | Admitting: Internal Medicine

## 2013-10-08 VITALS — BP 110/70 | HR 61 | Ht 69.0 in | Wt 213.0 lb

## 2013-10-08 DIAGNOSIS — I4892 Unspecified atrial flutter: Secondary | ICD-10-CM | POA: Diagnosis not present

## 2013-10-08 DIAGNOSIS — I2510 Atherosclerotic heart disease of native coronary artery without angina pectoris: Secondary | ICD-10-CM

## 2013-10-08 DIAGNOSIS — I25810 Atherosclerosis of coronary artery bypass graft(s) without angina pectoris: Secondary | ICD-10-CM | POA: Diagnosis not present

## 2013-10-08 NOTE — Progress Notes (Signed)
HPI Austin Diaz is a 68 yo who is referred for preop risk evaluation.  He is being evaluated for knee replacement The patient has known CAD, he is s/p CABG.  He also has PVOD He denies CP  Does some waking but is not that active  Uses machinery to help with work.   Breating is OK    Allergies  Allergen Reactions  . Morphine Other (See Comments)    HALLUCINATIONS    Current Outpatient Prescriptions  Medication Sig Dispense Refill  . aspirin 325 MG tablet Take 325 mg by mouth once.      Marland Kitchen aspirin EC 81 MG tablet Take 81 mg by mouth every morning.      . gabapentin (NEURONTIN) 600 MG tablet Take 600 mg by mouth 2 (two) times daily.      Marland Kitchen HYDROcodone-acetaminophen (NORCO) 10-325 MG per tablet Take 1 tablet by mouth at bedtime.      . metoprolol (TOPROL-XL) 200 MG 24 hr tablet Take 100 mg by mouth daily.      . nitroGLYCERIN (NITROSTAT) 0.4 MG SL tablet Place 1 tablet (0.4 mg total) under the tongue every 5 (five) minutes as needed.  25 tablet  6  . terazosin (HYTRIN) 10 MG capsule Take 10 mg by mouth at bedtime.       No current facility-administered medications for this visit.    Past Medical History  Diagnosis Date  . History of carotid endarterectomy     bilateral  . PVD (peripheral vascular disease)     noninvasive studies 10/2009 with ABI 0.35 on left and 0.70 on right  . SOB (shortness of breath)   . Hypertension     unspecified   . Nonfunctioning kidney     right kidney  . Tobacco abuse   . Diabetes mellitus   . Hyperlipidemia   . History of non-ST elevation myocardial infarction (NSTEMI) SEPT 2010  . S/P CABG x 2   . S/P femoral-popliteal bypass surgery   . Acute medial meniscus tear of left knee   . Arthritis   . Coronary artery disease CARDIOLOGIST- DR ARIDA -- LAST  VISIT 06-01-11  IN EPIC---   PT DENIES S & S    s/p 2 v CABG 01/2009.  LIMA to LAD, SVG to OM  . PAD (peripheral artery disease)   . Claudication in peripheral vascular disease   . Acute medial meniscal  injury of knee LEFT  . Anxiety   . Depression     Past Surgical History  Procedure Laterality Date  . Bilateral carotid endartectomy  RIGHT 1999/   LEFT 1995  . Left hip replacement      x3  LAST ONE 2003  . Right hip replacement      x2  . Right to left fem-fem bypass graft/ left fem-pop bypass graft/ right external iliac & common femoral endarterectomy  05-31-2010  . Cardiac catheterization  09- 24 & 27- 2010    HIGH-GRADE LEFT MAIN AND 2 VESSEL CAD,  S/P NSTEMI  . Renal artery angioplasty  05-26-2010    RIGHT COMMMON ILIAC ARTERY  . Lumbar disc surgery  YRS AGO  . Transthoracic echocardiogram  08-16-2010    NORMAL LVSF/ EF 55-60%/ MODERATELY DILATED LEFT ATRIUM  . Lexiscan myoview  05-28-2011  DR ARIDA ([place] [facility] [place])    SMALL DISTAL ANTEROSEPTAL REVERSIBLE DEFECT WITH NORMAL EF 60%./  NO LARGE AREAS OF ISCHEMIA OR HIGH RISK FEATURES.  . Cataract extraction w/ intraocular lens implant  RIGHT  . Knee surgery  Feb. 2013    Left knee  . Coronary artery bypass graft  02-03-2009    X2- VESSEL CABG;  LIMA to LAD, SVG to OM  . Cataract extraction w/phaco  02/11/2012    Procedure: CATARACT EXTRACTION PHACO AND INTRAOCULAR LENS PLACEMENT (IOC);  Surgeon: Susa Simmondsarroll F Haines, MD;  Location: [facility];  Service: Ophthalmology;  Laterality: Left;  CDE:11.27  . Cataract extraction      left    Family History  Problem Relation Age of Onset  . Osteoporosis Mother   . Stroke Mother     History   Social History  . Marital Status: Widowed    Spouse Name: N/A    Number of Children: N/A  . Years of Education: N/A   Occupational History  . Not on file.   Social History Main Topics  . Smoking status: Current Every Day Smoker -- 0.50 packs/day for 50 years    Types: Cigarettes  . Smokeless tobacco: Never Used  . Alcohol Use: 2.4 oz/week    4 Shots of liquor per week  . Drug Use: No  . Sexual Activity: Yes    Birth Control/ Protection: None   Other Topics Concern  . Not  on file   Social History Narrative  . No narrative on file    Review of Systems:  All systems reviewed.  They are negative to the above problem except as previously stated.  Vital Signs: BP 110/70  Pulse 61  Ht 5\' 9"  (1.753 m)  Wt 213 lb (96.616 kg)  BMI 31.44 kg/m2  SpO2 95%  Physical Exam Patient is in NAD HEENT:  Normocephalic, atraumatic. EOMI, PERRLA.  Neck: JVP is normal.  Positve bruits  CEA scars.    Lungs: clear to auscultation. No rales no wheezes.  Heart: Irregular rate and rhythm. Normal S1, S2. No S3.   No significant murmurs. PMI not displaced.  Abdomen:  Supple, nontender. Normal bowel sounds. No masses. No hepatomegaly.  Extremities:   Good distal pulses throughout. No lower extremity edema.  Musculoskeletal :moving all extremities.  Neuro:   alert and oriented x3.  CN II-XII grossly intact.  EKG Atrial fib  76 bpm.   Assessment and Plan: 1.  CAD  The patient is not having symptoms but he is not that active.  I would set up for lexiscan myoview to stratify. 2.  Atrial fib/flutter  He had been rate controlloed and on eliquis  [facility]  Stopped.  With other risks he needs to be on med.  Will get sampls  Continue.  3.  PVOD  Followed by [facility]  4.  Lipids  Not on statin   Check lipids.

## 2013-10-08 NOTE — Patient Instructions (Signed)
Your physician wants you to follow-up in: 6 months with Dr.Branch You will receive a reminder letter in the mail two months in advance. If you don't receive a letter, please call our office to schedule the follow-up appointment.    Your physician recommends that you continue on your current medications as directed. Please refer to the Current Medication list given to you today.     Your physician recommends that you return for lab work in: (BMET,CBC,LIPID)   Please do not eat or drink 12 hours before test    Thank you for choosing [facility] !

## 2013-10-09 ENCOUNTER — Encounter (HOSPITAL_COMMUNITY): Payer: Self-pay

## 2013-10-09 ENCOUNTER — Encounter (HOSPITAL_COMMUNITY)
Admission: RE | Admit: 2013-10-09 | Discharge: 2013-10-09 | Disposition: A | Discharge status: Home / Self Care | Payer: Medicare Other | Source: Ambulatory Visit | Attending: Internal Medicine | Admitting: Internal Medicine

## 2013-10-09 ENCOUNTER — Ambulatory Visit (HOSPITAL_COMMUNITY)
Admission: RE | Admit: 2013-10-09 | Discharge: 2013-10-09 | Disposition: A | Discharge status: Home / Self Care | Payer: Medicare Other | Source: Ambulatory Visit | Attending: Internal Medicine | Admitting: Internal Medicine

## 2013-10-09 DIAGNOSIS — I4891 Unspecified atrial fibrillation: Secondary | ICD-10-CM | POA: Diagnosis not present

## 2013-10-09 DIAGNOSIS — R079 Chest pain, unspecified: Secondary | ICD-10-CM

## 2013-10-09 DIAGNOSIS — Z951 Presence of aortocoronary bypass graft: Secondary | ICD-10-CM | POA: Insufficient documentation

## 2013-10-09 DIAGNOSIS — I25810 Atherosclerosis of coronary artery bypass graft(s) without angina pectoris: Secondary | ICD-10-CM

## 2013-10-09 DIAGNOSIS — Z01810 Encounter for preprocedural cardiovascular examination: Secondary | ICD-10-CM | POA: Insufficient documentation

## 2013-10-09 DIAGNOSIS — I2510 Atherosclerotic heart disease of native coronary artery without angina pectoris: Secondary | ICD-10-CM | POA: Insufficient documentation

## 2013-10-09 LAB — CBC
HCT: 41.2 % (ref 39.0–52.0)
Hemoglobin: 14.4 g/dL (ref 13.0–17.0)
MCH: 32.3 pg (ref 26.0–34.0)
MCHC: 35 g/dL (ref 30.0–36.0)
MCV: 92.4 fL (ref 78.0–100.0)
Platelets: 184 10*3/uL (ref 150–400)
RBC: 4.46 MIL/uL (ref 4.22–5.81)
RDW: 16 % — AB (ref 11.5–15.5)
WBC: 6.8 10*3/uL (ref 4.0–10.5)

## 2013-10-09 MED ORDER — TECHNETIUM TC 99M SESTAMIBI - CARDIOLITE
10.0000 | Freq: Once | INTRAVENOUS | Status: AC | PRN
  Administered 2013-10-09: 09:00:00 10 via INTRAVENOUS

## 2013-10-09 MED ORDER — REGADENOSON 0.4 MG/5ML IV SOLN
INTRAVENOUS | Status: AC
  Administered 2013-10-09: 0.4 mg via INTRAVENOUS
  Filled 2013-10-09: qty 5

## 2013-10-09 MED ORDER — SODIUM CHLORIDE 0.9 % IJ SOLN
INTRAMUSCULAR | Status: AC
  Administered 2013-10-09: 10 mL via INTRAVENOUS
  Filled 2013-10-09: qty 10

## 2013-10-09 MED ORDER — TECHNETIUM TC 99M SESTAMIBI GENERIC - CARDIOLITE
30.0000 | Freq: Once | INTRAVENOUS | Status: AC | PRN
  Administered 2013-10-09: 30 via INTRAVENOUS

## 2013-10-09 NOTE — Progress Notes (Signed)
Stress Lab Nurses Notes - Austin Diaz  Austin Diaz 10/09/2013 Reason for doing test: CAD, AFib & pre op evaluation Type of test: Marlane Hatcher Nurse performing test: Parke Poisson, RN Nuclear Medicine Tech: Lyndel Pleasure Echo Tech: Not Applicable MD performing test: Koneswaran/K.Lawrence NP Family MD: Marina Goodell Test explained and consent signed: yes IV started: 22g jelco, Saline lock flushed, No redness or edema and Saline lock started in radiology Symptoms: none Treatment/Intervention: None Reason test stopped: protocol completed After recovery IV was: Discontinued via X-ray tech and No redness or edema Patient to return to Nuc. Med at : 11:10 Patient discharged: Home [name] Condition upon discharge was: stable Comments: During test BP 149/87 & HR 77.  Recovery BP 106/80 & HR 67.  Symptoms resolved in recovery. Tawni Millers

## 2013-10-10 LAB — BASIC METABOLIC PANEL
BUN: 12 mg/dL (ref 6–23)
CHLORIDE: 102 meq/L (ref 96–112)
CO2: 30 mEq/L (ref 19–32)
Calcium: 9.2 mg/dL (ref 8.4–10.5)
Creat: 1.15 mg/dL (ref 0.50–1.35)
Glucose, Bld: 147 mg/dL — ABNORMAL HIGH (ref 70–99)
POTASSIUM: 4.2 meq/L (ref 3.5–5.3)
Sodium: 139 mEq/L (ref 135–145)

## 2013-10-10 LAB — LIPID PANEL
Cholesterol: 138 mg/dL (ref 0–200)
HDL: 37 mg/dL — ABNORMAL LOW (ref >39)
LDL Cholesterol: 81 mg/dL (ref 0–99)
TRIGLYCERIDES: 99 mg/dL (ref <150)
Total CHOL/HDL Ratio: 3.7 Ratio
VLDL: 20 mg/dL (ref 0–40)

## 2013-11-10 ENCOUNTER — Encounter (HOSPITAL_COMMUNITY): Payer: Medicare Other

## 2013-11-10 ENCOUNTER — Other Ambulatory Visit (HOSPITAL_COMMUNITY): Payer: Medicare Other

## 2013-11-10 ENCOUNTER — Ambulatory Visit: Payer: Medicare Other | Admitting: Vascular Surgery

## 2013-11-11 ENCOUNTER — Other Ambulatory Visit (HOSPITAL_COMMUNITY): Payer: Self-pay | Admitting: Orthopaedic Surgery

## 2013-11-27 ENCOUNTER — Encounter (HOSPITAL_COMMUNITY)
Admission: RE | Admit: 2013-11-27 | Discharge: 2013-11-27 | Disposition: A | Discharge status: Home / Self Care | Payer: Medicare Other | Source: Ambulatory Visit | Attending: Orthopaedic Surgery | Admitting: Orthopaedic Surgery

## 2013-11-27 ENCOUNTER — Ambulatory Visit (HOSPITAL_COMMUNITY)
Admission: RE | Admit: 2013-11-27 | Discharge: 2013-11-27 | Disposition: A | Discharge status: Home / Self Care | Payer: Medicare Other | Source: Ambulatory Visit | Attending: Orthopaedic Surgery | Admitting: Orthopaedic Surgery

## 2013-11-27 ENCOUNTER — Encounter (HOSPITAL_COMMUNITY): Payer: Self-pay

## 2013-11-27 ENCOUNTER — Encounter (HOSPITAL_COMMUNITY): Payer: Self-pay | Admitting: Pharmacy Technician

## 2013-11-27 DIAGNOSIS — Z01818 Encounter for other preprocedural examination: Secondary | ICD-10-CM | POA: Insufficient documentation

## 2013-11-27 DIAGNOSIS — M1710 Unilateral primary osteoarthritis, unspecified knee: Secondary | ICD-10-CM | POA: Diagnosis not present

## 2013-11-27 HISTORY — DX: Cardiac arrhythmia, unspecified: I49.9

## 2013-11-27 HISTORY — DX: Myoneural disorder, unspecified: G70.9

## 2013-11-27 LAB — URINALYSIS, ROUTINE W REFLEX MICROSCOPIC
BILIRUBIN URINE: NEGATIVE
Glucose, UA: NEGATIVE mg/dL
HGB URINE DIPSTICK: NEGATIVE
KETONES UR: NEGATIVE mg/dL
Leukocytes, UA: NEGATIVE
Nitrite: NEGATIVE
Protein, ur: NEGATIVE mg/dL
SPECIFIC GRAVITY, URINE: 1.018 (ref 1.005–1.030)
UROBILINOGEN UA: 0.2 mg/dL (ref 0.0–1.0)
pH: 5.5 (ref 5.0–8.0)

## 2013-11-27 LAB — COMPREHENSIVE METABOLIC PANEL
ALT: 10 U/L (ref 0–53)
ANION GAP: 12 (ref 5–15)
AST: 14 U/L (ref 0–37)
Albumin: 3.4 g/dL — ABNORMAL LOW (ref 3.5–5.2)
Alkaline Phosphatase: 94 U/L (ref 39–117)
BUN: 11 mg/dL (ref 6–23)
CALCIUM: 9.2 mg/dL (ref 8.4–10.5)
CO2: 29 meq/L (ref 19–32)
CREATININE: 1.18 mg/dL (ref 0.50–1.35)
Chloride: 101 mEq/L (ref 96–112)
GFR calc Af Amer: 71 mL/min — ABNORMAL LOW (ref >90)
GFR, EST NON AFRICAN AMERICAN: 62 mL/min — AB (ref >90)
Glucose, Bld: 85 mg/dL (ref 70–99)
Potassium: 3.7 mEq/L (ref 3.7–5.3)
Sodium: 142 mEq/L (ref 137–147)
Total Bilirubin: 0.6 mg/dL (ref 0.3–1.2)
Total Protein: 7.4 g/dL (ref 6.0–8.3)

## 2013-11-27 LAB — SURGICAL PCR SCREEN
MRSA, PCR: NEGATIVE
STAPHYLOCOCCUS AUREUS: NEGATIVE

## 2013-11-27 LAB — CBC
HCT: 44.8 % (ref 39.0–52.0)
Hemoglobin: 15.1 g/dL (ref 13.0–17.0)
MCH: 32.4 pg (ref 26.0–34.0)
MCHC: 33.7 g/dL (ref 30.0–36.0)
MCV: 96.1 fL (ref 78.0–100.0)
Platelets: 167 10*3/uL (ref 150–400)
RBC: 4.66 MIL/uL (ref 4.22–5.81)
RDW: 13.4 % (ref 11.5–15.5)
WBC: 7.1 10*3/uL (ref 4.0–10.5)

## 2013-11-27 LAB — PROTIME-INR
INR: 1.06 (ref 0.00–1.49)
PROTHROMBIN TIME: 13.8 s (ref 11.6–15.2)

## 2013-11-27 LAB — APTT: aPTT: 35 seconds (ref 24–37)

## 2013-11-27 NOTE — H&P (Signed)
TOTAL KNEE ADMISSION H&P  Patient is being admitted for left total knee arthroplasty.  Subjective:  Chief Complaint:left knee pain.  HPI: Austin Diaz, 68 y.o. male, has a history of pain and functional disability in the left knee due to arthritis and has failed non-surgical conservative treatments for greater than 12 weeks to includeNSAID's and/or analgesics, corticosteriod injections, viscosupplementation injections and activity modification.  Onset of symptoms was gradual, starting 3 years ago with gradually worsening course since that time. The patient noted prior procedures on the knee to include  arthroscopy on the left knee(s).  Patient currently rates pain in the left knee(s) at 8 out of 10 with activity. Patient has worsening of pain with activity and weight bearing, pain that interferes with activities of daily living and crepitus.  Patient has evidence of subchondral cysts, subchondral sclerosis, periarticular osteophytes and joint space narrowing by imaging studies. This patient has had bilateral total hip replacements. There is no active infection.  Patient Active Problem List   Diagnosis Date Noted  . Atherosclerosis of native arteries of the extremities with intermittent claudication 05/20/2012  . Numbness 05/20/2012  . Swelling 05/20/2012  . Claudication 09/18/2011  . Carotid artery stenosis, asymptomatic 09/18/2011  . PAD (peripheral artery disease) 08/16/2011  . Preop cardiovascular exam 06/01/2011  . DJD (degenerative joint disease) 05/18/2011  . DYSLIPIDEMIA 04/03/2010  . TOBACCO ABUSE 02/28/2009  . ESSENTIAL HYPERTENSION, BENIGN 02/28/2009  . CAD 02/28/2009  . HYPERTENSION, UNSPECIFIED 01/22/2009  . PERIPHERAL VASCULAR DISEASE 01/22/2009  . SHORTNESS OF BREATH 01/22/2009  . CAROTID ENDARTERECTOMY, BILATERAL, HX OF 01/22/2009   Past Medical History  Diagnosis Date  . History of carotid endarterectomy     bilateral  . PVD (peripheral vascular disease)    noninvasive studies 10/2009 with ABI 0.35 on left and 0.70 on right  . SOB (shortness of breath)   . Hypertension     unspecified   . Nonfunctioning kidney     right kidney  . Tobacco abuse   . Diabetes mellitus   . Hyperlipidemia   . History of non-ST elevation myocardial infarction (NSTEMI) SEPT 2010  . S/P CABG x 2   . S/P femoral-popliteal bypass surgery   . Acute medial meniscus tear of left knee   . Arthritis   . Coronary artery disease CARDIOLOGIST- DR ARIDA -- LAST  VISIT 06-01-11  IN [facility]---   PT DENIES S & S    s/p 2 v CABG 01/2009.  LIMA to LAD, SVG to OM  . PAD (peripheral artery disease)   . Claudication in peripheral vascular disease   . Acute medial meniscal injury of knee LEFT  . Anxiety   . Depression     Past Surgical History  Procedure Laterality Date  . Bilateral carotid endartectomy  RIGHT 1999/   LEFT 1995  . Left hip replacement      x3  LAST ONE 2003  . Right hip replacement      x2  . Right to left fem-fem bypass graft/ left fem-pop bypass graft/ right external iliac & common femoral endarterectomy  05-31-2010  . Cardiac catheterization  09- 24 & 27- 2010    HIGH-GRADE LEFT MAIN AND 2 VESSEL CAD,  S/P NSTEMI  . Renal artery angioplasty  05-26-2010    RIGHT COMMMON ILIAC ARTERY  . Lumbar disc surgery  YRS AGO  . Transthoracic echocardiogram  08-16-2010    NORMAL LVSF/ EF 55-60%/ MODERATELY DILATED LEFT ATRIUM  . Steffanie Dunn  05-28-2011  DR  ARIDA ( [facility] , [place])    SMALL DISTAL ANTEROSEPTAL REVERSIBLE DEFECT WITH NORMAL EF 60%./  NO LARGE AREAS OF ISCHEMIA OR HIGH RISK FEATURES.  . Cataract extraction w/ intraocular lens implant      RIGHT  . Knee surgery  Feb. 2013    Left knee  . Coronary artery bypass graft  02-03-2009    X2- VESSEL CABG;  LIMA to LAD, SVG to OM  . Cataract extraction w/phaco  02/11/2012    Procedure: CATARACT EXTRACTION PHACO AND INTRAOCULAR LENS PLACEMENT (IOC);  Surgeon: Susa Simmonds, MD;  Location: [facility];   Service: Ophthalmology;  Laterality: Left;  CDE:11.27  . Cataract extraction      left    No prescriptions prior to admission   Allergies  Allergen Reactions  . Morphine Other (See Comments)    HALLUCINATIONS    History  Substance Use Topics  . Smoking status: Current Every Day Smoker -- 0.50 packs/day for 50 years    Types: Cigarettes  . Smokeless tobacco: Never Used  . Alcohol Use: 2.4 oz/week    4 Shots of liquor per week    Family History  Problem Relation Age of Onset  . Osteoporosis Mother   . Stroke Mother      Review of Systems  Musculoskeletal: Positive for joint pain.       Left knee.  S/P bilateral total hip replacement    Objective:  Physical Exam  Constitutional: He is oriented to person, place, and time. He appears well-developed and well-nourished.  HENT:  Head: Normocephalic and atraumatic.  Eyes: EOM are normal. Pupils are equal, round, and reactive to light.  Neck: Normal range of motion.  Cardiovascular: Normal rate and regular rhythm.   Respiratory: Effort normal and breath sounds normal.  GI: Soft.  Musculoskeletal:  Has no pain with hip range of motion.  Reflexes are 2+.  There is 3+ knee effusion.  He flexes to 100 degrees, lacks 20 degrees reaching full extension.  He has had previous free vascularized fibular graft of his left hip with mid fibular donor site with present clips  Neurological: He is alert and oriented to person, place, and time.  Skin: Skin is warm and dry.  Psychiatric: He has a normal mood and affect.    Vital signs in last 24 hours: Temp:  [98.5 F (36.9 C)] 98.5 F (36.9 C) (07/24 1349) Pulse Rate:  [58] 58 (07/24 1349) Resp:  [20] 20 (07/24 1349) BP: (112)/(72) 112/72 mmHg (07/24 1349) SpO2:  [95 %] 95 % (07/24 1349) Weight:  [93.759 kg (206 lb 11.2 oz)] 93.759 kg (206 lb 11.2 oz) (07/24 1349)  Labs:   Estimated body mass index is 31.44 kg/(m^2) as calculated from the following:   Height as of 10/08/13: 5\' 9"   (1.753 m).   Weight as of 10/08/13: 96.616 kg (213 lb).   Imaging Review Plain radiographs demonstrate severe degenerative joint disease of the left knee(s). The overall alignment isneutral. The bone quality appears to be adequate for age and reported activity level.  Assessment/Plan:  End stage arthritis, left knee   The patient history, physical examination, clinical judgment of the provider and imaging studies are consistent with end stage degenerative joint disease of the left knee(s) and total knee arthroplasty is deemed medically necessary. The treatment options including medical management, injection therapy arthroscopy and arthroplasty were discussed at length. The risks and benefits of total knee arthroplasty were presented and reviewed. The risks due to aseptic loosening,  infection, stiffness, patella tracking problems, thromboembolic complications and other imponderables were discussed. The patient acknowledged the explanation, agreed to proceed with the plan and consent was signed. Patient is being admitted for inpatient treatment for surgery, pain control, PT, OT, prophylactic antibiotics, VTE prophylaxis, progressive ambulation and ADL's and discharge planning. The patient is planning to be discharged home with home health services

## 2013-11-27 NOTE — Pre-Procedure Instructions (Signed)
Austin KoyanagiWilliam H Diaz  11/27/2013   Your procedure is scheduled on:  12/04/2013  [facility] Ad[facility]- ENTRANCE A  At  7:45 AM.  Call this number if you have problems the morning of surgery: [phone]   Remember:   Do not eat food or drink liquids after midnight:     on Thursday EVENING.   Take these medicines the morning of surgery with A SIP OF WATER: LASIX, PAIN MEDICINE  If needed    Do not wear jewelry  Do not wear lotions, powders, or perfumes. You may wear deodorant.  Do not shave 48 hours prior to surgery. Men may shave face and neck.  Do not bring valuables to the hospital.  [facility] is not responsible                  for any belongings or valuables.               Contacts, dentures or bridgework may not be worn into surgery.  Leave suitcase in the car. After surgery it may be brought to your room.  For patients admitted to the hospital, discharge time is determined by your                treatment team.               Patients discharged the day of surgery will not be allowed to drive  home.  Name and phone number of your driver: /family  Special Instructions: Special Instructions: [facility] - Preparing for Surgery  Before surgery, you can play an important role.  Because skin is not sterile, your skin needs to be as free of germs as possible.  You can reduce the number of germs on you skin by washing with CHG (chlorahexidine gluconate) soap before surgery.  CHG is an antiseptic cleaner which kills germs and bonds with the skin to continue killing germs even after washing.  Please DO NOT use if you have an allergy to CHG or antibacterial soaps.  If your skin becomes reddened/irritated stop using the CHG and inform your nurse when you arrive at Short Stay.  Do not shave (including legs and underarms) for at least 48 hours prior to the first CHG shower.  You may shave your face.  Please follow these instructions carefully:   1.  Shower with CHG Soap  the night before surgery and the  morning of Surgery.  2.  If you choose to wash your hair, wash your hair first as usual with your  normal shampoo.  3.  After you shampoo, rinse your hair and body thoroughly to remove the  Shampoo.  4.  Use CHG as you would any other liquid soap.  You can apply chg directly to the skin and wash gently with scrungie or a clean washcloth.  5.  Apply the CHG Soap to your body ONLY FROM THE NECK DOWN.    Do not use on open wounds or open sores.  Avoid contact with your eyes, ears, mouth and genitals (private parts).  Wash genitals (private parts)   with your normal soap.  6.  Wash thoroughly, paying special attention to the area where your surgery will be performed.  7.  Thoroughly rinse your body with warm water from the neck down.  8.  DO NOT shower/wash with your normal soap after using and rinsing off   the CHG Soap.  9.  Pat yourself dry with a clean  towel.            10.  Wear clean pajamas.            11.  Place clean sheets on your bed the night of your first shower and do not sleep with pets.  Day of Surgery  Do not apply any lotions/deodorants the morning of surgery.  Please wear clean clothes to the hospital/surgery center.   Please read over the following fact sheets that you were given: Pain Booklet, Coughing and Deep Breathing, Blood Transfusion Information, MRSA Information and Surgical Site Infection Prevention

## 2013-12-03 MED ORDER — CHLORHEXIDINE GLUCONATE 4 % EX LIQD
60.0000 mL | Freq: Once | CUTANEOUS | Status: DC
  Filled 2013-12-03: qty 60

## 2013-12-03 MED ORDER — CEFAZOLIN SODIUM-DEXTROSE 2-3 GM-% IV SOLR
2.0000 g | INTRAVENOUS | Status: AC
  Administered 2013-12-04: 2 g via INTRAVENOUS
  Filled 2013-12-03: qty 50

## 2013-12-04 ENCOUNTER — Inpatient Hospital Stay (HOSPITAL_COMMUNITY)
Admission: RE | Admit: 2013-12-04 | Discharge: 2013-12-06 | DRG: 470 | Disposition: A | Discharge status: Home Health | Payer: Medicare Other | Source: Ambulatory Visit | Attending: Orthopaedic Surgery | Admitting: Orthopaedic Surgery

## 2013-12-04 ENCOUNTER — Encounter (HOSPITAL_COMMUNITY): Payer: Self-pay | Admitting: Anesthesiology

## 2013-12-04 ENCOUNTER — Encounter (HOSPITAL_COMMUNITY)
Admission: RE | Disposition: A | Discharge status: Home Health | Payer: Self-pay | Source: Ambulatory Visit | Attending: Orthopaedic Surgery

## 2013-12-04 ENCOUNTER — Encounter (HOSPITAL_COMMUNITY): Payer: Medicare Other | Admitting: Vascular Surgery

## 2013-12-04 ENCOUNTER — Inpatient Hospital Stay (HOSPITAL_COMMUNITY): Payer: Medicare Other | Admitting: Anesthesiology

## 2013-12-04 DIAGNOSIS — F17200 Nicotine dependence, unspecified, uncomplicated: Secondary | ICD-10-CM | POA: Diagnosis present

## 2013-12-04 DIAGNOSIS — I252 Old myocardial infarction: Secondary | ICD-10-CM

## 2013-12-04 DIAGNOSIS — F329 Major depressive disorder, single episode, unspecified: Secondary | ICD-10-CM | POA: Diagnosis present

## 2013-12-04 DIAGNOSIS — E119 Type 2 diabetes mellitus without complications: Secondary | ICD-10-CM | POA: Diagnosis present

## 2013-12-04 DIAGNOSIS — M1712 Unilateral primary osteoarthritis, left knee: Secondary | ICD-10-CM | POA: Diagnosis present

## 2013-12-04 DIAGNOSIS — F3289 Other specified depressive episodes: Secondary | ICD-10-CM | POA: Diagnosis present

## 2013-12-04 DIAGNOSIS — M1710 Unilateral primary osteoarthritis, unspecified knee: Principal | ICD-10-CM | POA: Diagnosis present

## 2013-12-04 DIAGNOSIS — M25569 Pain in unspecified knee: Secondary | ICD-10-CM | POA: Diagnosis not present

## 2013-12-04 DIAGNOSIS — I739 Peripheral vascular disease, unspecified: Secondary | ICD-10-CM | POA: Diagnosis present

## 2013-12-04 DIAGNOSIS — I2510 Atherosclerotic heart disease of native coronary artery without angina pectoris: Secondary | ICD-10-CM | POA: Diagnosis present

## 2013-12-04 DIAGNOSIS — I70219 Atherosclerosis of native arteries of extremities with intermittent claudication, unspecified extremity: Secondary | ICD-10-CM | POA: Diagnosis present

## 2013-12-04 DIAGNOSIS — I10 Essential (primary) hypertension: Secondary | ICD-10-CM | POA: Diagnosis present

## 2013-12-04 DIAGNOSIS — Z951 Presence of aortocoronary bypass graft: Secondary | ICD-10-CM | POA: Diagnosis not present

## 2013-12-04 DIAGNOSIS — F411 Generalized anxiety disorder: Secondary | ICD-10-CM | POA: Diagnosis present

## 2013-12-04 DIAGNOSIS — G8918 Other acute postprocedural pain: Secondary | ICD-10-CM | POA: Diagnosis not present

## 2013-12-04 DIAGNOSIS — E785 Hyperlipidemia, unspecified: Secondary | ICD-10-CM | POA: Diagnosis present

## 2013-12-04 DIAGNOSIS — R0602 Shortness of breath: Secondary | ICD-10-CM | POA: Diagnosis not present

## 2013-12-04 DIAGNOSIS — Z96649 Presence of unspecified artificial hip joint: Secondary | ICD-10-CM

## 2013-12-04 HISTORY — PX: KNEE ARTHROPLASTY: SHX992

## 2013-12-04 LAB — GLUCOSE, CAPILLARY
GLUCOSE-CAPILLARY: 143 mg/dL — AB (ref 70–99)
GLUCOSE-CAPILLARY: 161 mg/dL — AB (ref 70–99)
Glucose-Capillary: 114 mg/dL — ABNORMAL HIGH (ref 70–99)

## 2013-12-04 LAB — TYPE AND SCREEN
ABO/RH(D): A POS
Antibody Screen: NEGATIVE

## 2013-12-04 SURGERY — ARTHROPLASTY, KNEE, TOTAL, USING IMAGELESS COMPUTER-ASSISTED NAVIGATION
Anesthesia: General | Site: Knee | Laterality: Left

## 2013-12-04 MED ORDER — ARTIFICIAL TEARS OP OINT
TOPICAL_OINTMENT | OPHTHALMIC | Status: AC
  Filled 2013-12-04: qty 3.5

## 2013-12-04 MED ORDER — FENTANYL CITRATE 0.05 MG/ML IJ SOLN
INTRAMUSCULAR | Status: AC
  Filled 2013-12-04: qty 2

## 2013-12-04 MED ORDER — NEOSTIGMINE METHYLSULFATE 10 MG/10ML IV SOLN
INTRAVENOUS | Status: AC
  Filled 2013-12-04: qty 1

## 2013-12-04 MED ORDER — SODIUM CHLORIDE 0.9 % IR SOLN
Status: DC | PRN
  Administered 2013-12-04: 1000 mL

## 2013-12-04 MED ORDER — ACETAMINOPHEN 650 MG RE SUPP: 650.0000 mg | Freq: Four times a day (QID) | RECTAL | Status: DC | PRN

## 2013-12-04 MED ORDER — METOCLOPRAMIDE HCL 10 MG PO TABS: 5.0000 mg | ORAL_TABLET | Freq: Three times a day (TID) | ORAL | Status: DC | PRN

## 2013-12-04 MED ORDER — INSULIN ASPART 100 UNIT/ML ~~LOC~~ SOLN
0.0000 [IU] | Freq: Three times a day (TID) | SUBCUTANEOUS | Status: DC
  Administered 2013-12-05: 3 [IU] via SUBCUTANEOUS
  Administered 2013-12-05: 2 [IU] via SUBCUTANEOUS

## 2013-12-04 MED ORDER — OXYCODONE HCL 5 MG/5ML PO SOLN: 5.0000 mg | Freq: Once | ORAL | Status: DC | PRN

## 2013-12-04 MED ORDER — DOCUSATE SODIUM 100 MG PO CAPS
300.0000 mg | ORAL_CAPSULE | Freq: Every day | ORAL | Status: DC
  Administered 2013-12-04 – 2013-12-05 (×2): 300 mg via ORAL
  Filled 2013-12-04 (×2): qty 3

## 2013-12-04 MED ORDER — FLEET ENEMA 7-19 GM/118ML RE ENEM: 1.0000 | ENEMA | Freq: Once | RECTAL | Status: AC | PRN

## 2013-12-04 MED ORDER — ASPIRIN EC 325 MG PO TBEC
325.0000 mg | DELAYED_RELEASE_TABLET | Freq: Every day | ORAL | Status: DC
Start: 2013-12-05 — End: 2013-12-06
  Administered 2013-12-05 – 2013-12-06 (×2): 325 mg via ORAL
  Filled 2013-12-04 (×3): qty 1

## 2013-12-04 MED ORDER — ONDANSETRON HCL 4 MG/2ML IJ SOLN
INTRAMUSCULAR | Status: DC | PRN
  Administered 2013-12-04: 4 mg via INTRAVENOUS

## 2013-12-04 MED ORDER — FENTANYL CITRATE 0.05 MG/ML IJ SOLN
INTRAMUSCULAR | Status: AC
  Filled 2013-12-04: qty 5

## 2013-12-04 MED ORDER — EPHEDRINE SULFATE 50 MG/ML IJ SOLN
INTRAMUSCULAR | Status: DC | PRN
  Administered 2013-12-04 (×6): 5 mg via INTRAVENOUS

## 2013-12-04 MED ORDER — ASPIRIN 325 MG PO TABS: 325.0000 mg | ORAL_TABLET | Freq: Every day | ORAL | Status: DC

## 2013-12-04 MED ORDER — GLYCOPYRROLATE 0.2 MG/ML IJ SOLN
INTRAMUSCULAR | Status: AC
  Filled 2013-12-04: qty 2

## 2013-12-04 MED ORDER — ARTIFICIAL TEARS OP OINT
TOPICAL_OINTMENT | OPHTHALMIC | Status: DC | PRN
  Administered 2013-12-04: 1 via OPHTHALMIC

## 2013-12-04 MED ORDER — KETOROLAC TROMETHAMINE 15 MG/ML IJ SOLN
15.0000 mg | Freq: Four times a day (QID) | INTRAMUSCULAR | Status: AC
  Administered 2013-12-04 – 2013-12-05 (×3): 15 mg via INTRAVENOUS
  Filled 2013-12-04 (×3): qty 1

## 2013-12-04 MED ORDER — METOCLOPRAMIDE HCL 5 MG/ML IJ SOLN: 5.0000 mg | Freq: Three times a day (TID) | INTRAMUSCULAR | Status: DC | PRN

## 2013-12-04 MED ORDER — KCL IN DEXTROSE-NACL 20-5-0.45 MEQ/L-%-% IV SOLN
INTRAVENOUS | Status: DC
  Administered 2013-12-04: 75 mL/h via INTRAVENOUS
  Filled 2013-12-04 (×5): qty 1000

## 2013-12-04 MED ORDER — METOPROLOL SUCCINATE ER 100 MG PO TB24
100.0000 mg | ORAL_TABLET | Freq: Every day | ORAL | Status: DC
  Administered 2013-12-04: 100 mg via ORAL
  Filled 2013-12-04 (×3): qty 1

## 2013-12-04 MED ORDER — PROPOFOL 10 MG/ML IV BOLUS
INTRAVENOUS | Status: AC
Start: 2013-12-04 — End: 2013-12-04
  Filled 2013-12-04: qty 20

## 2013-12-04 MED ORDER — HYDROMORPHONE HCL PF 1 MG/ML IJ SOLN: 1.0000 mg | INTRAMUSCULAR | Status: DC | PRN

## 2013-12-04 MED ORDER — ZOLPIDEM TARTRATE 5 MG PO TABS: 5.0000 mg | ORAL_TABLET | Freq: Every evening | ORAL | Status: DC | PRN

## 2013-12-04 MED ORDER — PROPOFOL 10 MG/ML IV BOLUS
INTRAVENOUS | Status: DC | PRN
  Administered 2013-12-04: 150 mg via INTRAVENOUS

## 2013-12-04 MED ORDER — OXYCODONE-ACETAMINOPHEN 5-325 MG PO TABS: 1.0000 | ORAL_TABLET | ORAL | Status: DC | PRN

## 2013-12-04 MED ORDER — LIDOCAINE HCL (CARDIAC) 20 MG/ML IV SOLN
INTRAVENOUS | Status: DC | PRN
  Administered 2013-12-04 (×2): 50 mg via INTRAVENOUS

## 2013-12-04 MED ORDER — NEOSTIGMINE METHYLSULFATE 10 MG/10ML IV SOLN
INTRAVENOUS | Status: DC | PRN
  Administered 2013-12-04: 3 mg via INTRAVENOUS

## 2013-12-04 MED ORDER — ROCURONIUM BROMIDE 100 MG/10ML IV SOLN
INTRAVENOUS | Status: DC | PRN
  Administered 2013-12-04: 35 mg via INTRAVENOUS

## 2013-12-04 MED ORDER — DIPHENHYDRAMINE HCL 12.5 MG/5ML PO ELIX: 12.5000 mg | ORAL_SOLUTION | ORAL | Status: DC | PRN

## 2013-12-04 MED ORDER — ONDANSETRON HCL 4 MG/2ML IJ SOLN: 4.0000 mg | Freq: Four times a day (QID) | INTRAMUSCULAR | Status: DC | PRN

## 2013-12-04 MED ORDER — FENTANYL CITRATE 0.05 MG/ML IJ SOLN
25.0000 ug | INTRAMUSCULAR | Status: DC | PRN
  Administered 2013-12-04 (×3): 50 ug via INTRAVENOUS

## 2013-12-04 MED ORDER — ONDANSETRON HCL 4 MG PO TABS: 4.0000 mg | ORAL_TABLET | Freq: Four times a day (QID) | ORAL | Status: DC | PRN

## 2013-12-04 MED ORDER — GABAPENTIN 600 MG PO TABS
600.0000 mg | ORAL_TABLET | Freq: Two times a day (BID) | ORAL | Status: DC
  Administered 2013-12-04 – 2013-12-06 (×3): 600 mg via ORAL
  Filled 2013-12-04 (×5): qty 1

## 2013-12-04 MED ORDER — ROCURONIUM BROMIDE 50 MG/5ML IV SOLN
INTRAVENOUS | Status: AC
  Filled 2013-12-04: qty 1

## 2013-12-04 MED ORDER — GLYCOPYRROLATE 0.2 MG/ML IJ SOLN
INTRAMUSCULAR | Status: DC | PRN
  Administered 2013-12-04 (×2): 0.2 mg via INTRAVENOUS
  Administered 2013-12-04: 0.4 mg via INTRAVENOUS

## 2013-12-04 MED ORDER — NITROGLYCERIN 0.4 MG SL SUBL: 0.4000 mg | SUBLINGUAL_TABLET | SUBLINGUAL | Status: DC | PRN

## 2013-12-04 MED ORDER — OXYCODONE HCL 5 MG PO TABS
5.0000 mg | ORAL_TABLET | ORAL | Status: DC | PRN
  Administered 2013-12-04 (×2): 5 mg via ORAL
  Administered 2013-12-04: 10 mg via ORAL
  Administered 2013-12-05 – 2013-12-06 (×5): 5 mg via ORAL
  Filled 2013-12-04 (×6): qty 1
  Filled 2013-12-04: qty 2
  Filled 2013-12-04: qty 1

## 2013-12-04 MED ORDER — MENTHOL 3 MG MT LOZG: 1.0000 | LOZENGE | OROMUCOSAL | Status: DC | PRN

## 2013-12-04 MED ORDER — METHOCARBAMOL 500 MG PO TABS: 500.0000 mg | ORAL_TABLET | Freq: Four times a day (QID) | ORAL | Status: DC | PRN

## 2013-12-04 MED ORDER — FENTANYL CITRATE 0.05 MG/ML IJ SOLN
INTRAMUSCULAR | Status: AC
  Administered 2013-12-04: 100 ug
  Filled 2013-12-04: qty 2

## 2013-12-04 MED ORDER — LACTATED RINGERS IV SOLN
INTRAVENOUS | Status: DC | PRN
  Administered 2013-12-04 (×2): via INTRAVENOUS

## 2013-12-04 MED ORDER — SODIUM CHLORIDE 0.9 % IJ SOLN
INTRAMUSCULAR | Status: AC
  Filled 2013-12-04: qty 10

## 2013-12-04 MED ORDER — VITAMIN B-12 5000 MCG SL SUBL: 5000.0000 ug | SUBLINGUAL_TABLET | Freq: Every day | SUBLINGUAL | Status: DC

## 2013-12-04 MED ORDER — BISACODYL 10 MG RE SUPP
10.0000 mg | Freq: Every day | RECTAL | Status: DC | PRN
Start: 2013-12-04 — End: 2013-12-06

## 2013-12-04 MED ORDER — FENTANYL CITRATE 0.05 MG/ML IJ SOLN
INTRAMUSCULAR | Status: DC | PRN
  Administered 2013-12-04: 50 ug via INTRAVENOUS
  Administered 2013-12-04: 25 ug via INTRAVENOUS
  Administered 2013-12-04 (×4): 50 ug via INTRAVENOUS
  Administered 2013-12-04: 25 ug via INTRAVENOUS
  Administered 2013-12-04: 50 ug via INTRAVENOUS

## 2013-12-04 MED ORDER — LACTATED RINGERS IV SOLN
INTRAVENOUS | Status: DC
  Administered 2013-12-04: 09:00:00 via INTRAVENOUS

## 2013-12-04 MED ORDER — ONDANSETRON HCL 4 MG/2ML IJ SOLN
INTRAMUSCULAR | Status: AC
Start: 2013-12-04 — End: 2013-12-04
  Filled 2013-12-04: qty 2

## 2013-12-04 MED ORDER — METHOCARBAMOL 1000 MG/10ML IJ SOLN
500.0000 mg | Freq: Four times a day (QID) | INTRAVENOUS | Status: DC | PRN
  Filled 2013-12-04: qty 5

## 2013-12-04 MED ORDER — MIDAZOLAM HCL 2 MG/2ML IJ SOLN
INTRAMUSCULAR | Status: AC
  Administered 2013-12-04: 2 mg
  Filled 2013-12-04: qty 2

## 2013-12-04 MED ORDER — TERAZOSIN HCL 5 MG PO CAPS
10.0000 mg | ORAL_CAPSULE | Freq: Every day | ORAL | Status: DC
  Administered 2013-12-04 – 2013-12-05 (×2): 10 mg via ORAL
  Filled 2013-12-04 (×3): qty 2

## 2013-12-04 MED ORDER — FENTANYL CITRATE 0.05 MG/ML IJ SOLN
INTRAMUSCULAR | Status: AC
  Administered 2013-12-04: 13:00:00
  Filled 2013-12-04: qty 4

## 2013-12-04 MED ORDER — FUROSEMIDE 40 MG PO TABS
40.0000 mg | ORAL_TABLET | Freq: Every day | ORAL | Status: DC
  Administered 2013-12-06: 40 mg via ORAL
  Filled 2013-12-04 (×2): qty 1

## 2013-12-04 MED ORDER — METHOCARBAMOL 500 MG PO TABS
500.0000 mg | ORAL_TABLET | Freq: Four times a day (QID) | ORAL | Status: DC | PRN
  Administered 2013-12-04: 500 mg via ORAL
  Filled 2013-12-04: qty 1

## 2013-12-04 MED ORDER — POLYETHYLENE GLYCOL 3350 17 G PO PACK: 17.0000 g | PACK | Freq: Every day | ORAL | Status: DC | PRN

## 2013-12-04 MED ORDER — OXYCODONE HCL 5 MG PO TABS: 5.0000 mg | ORAL_TABLET | Freq: Once | ORAL | Status: DC | PRN

## 2013-12-04 MED ORDER — ONDANSETRON HCL 4 MG/2ML IJ SOLN: 4.0000 mg | Freq: Once | INTRAMUSCULAR | Status: DC | PRN

## 2013-12-04 MED ORDER — PHENOL 1.4 % MT LIQD: 1.0000 | OROMUCOSAL | Status: DC | PRN

## 2013-12-04 MED ORDER — VITAMIN B-12 1000 MCG PO TABS
5000.0000 ug | ORAL_TABLET | Freq: Every day | ORAL | Status: DC
  Administered 2013-12-06: 5000 ug via ORAL
  Filled 2013-12-04 (×2): qty 5

## 2013-12-04 MED ORDER — ACETAMINOPHEN 325 MG PO TABS: 650.0000 mg | ORAL_TABLET | Freq: Four times a day (QID) | ORAL | Status: DC | PRN

## 2013-12-04 SURGICAL SUPPLY — 77 items
APL SKNCLS STERI-STRIP NONHPOA (GAUZE/BANDAGES/DRESSINGS) ×1
BANDAGE ELASTIC 4 VELCRO ST LF (GAUZE/BANDAGES/DRESSINGS) ×3 IMPLANT
BANDAGE ELASTIC 6 VELCRO ST LF (GAUZE/BANDAGES/DRESSINGS) ×2 IMPLANT
BANDAGE ESMARK 6X9 LF (GAUZE/BANDAGES/DRESSINGS) ×1 IMPLANT
BENZOIN TINCTURE PRP APPL 2/3 (GAUZE/BANDAGES/DRESSINGS) ×3 IMPLANT
BLADE SAGITTAL 25.0X1.19X90 (BLADE) ×2 IMPLANT
BLADE SAGITTAL 25.0X1.19X90MM (BLADE) ×1
BLADE SAW SGTL 13X75X1.27 (BLADE) ×3 IMPLANT
BLADE SURG 10 STRL SS (BLADE) ×2 IMPLANT
BNDG CMPR 9X6 STRL LF SNTH (GAUZE/BANDAGES/DRESSINGS) ×1
BNDG CMPR MED 10X6 ELC LF (GAUZE/BANDAGES/DRESSINGS) ×1
BNDG ELASTIC 6X10 VLCR STRL LF (GAUZE/BANDAGES/DRESSINGS) ×3 IMPLANT
BNDG ESMARK 6X9 LF (GAUZE/BANDAGES/DRESSINGS) ×3
BOWL SMART MIX CTS (DISPOSABLE) ×3 IMPLANT
CAPT RP KNEE ×2 IMPLANT
CEMENT HV SMART SET (Cement) ×6 IMPLANT
CLOSURE WOUND 1/2 X4 (GAUZE/BANDAGES/DRESSINGS) ×1
COVER BACK TABLE 24X17X13 BIG (DRAPES) IMPLANT
COVER SURGICAL LIGHT HANDLE (MISCELLANEOUS) ×3 IMPLANT
CUFF TOURNIQUET SINGLE 34IN LL (TOURNIQUET CUFF) ×3 IMPLANT
CUFF TOURNIQUET SINGLE 44IN (TOURNIQUET CUFF) IMPLANT
DRAPE ORTHO SPLIT 77X108 STRL (DRAPES) ×6
DRAPE SURG ORHT 6 SPLT 77X108 (DRAPES) ×2 IMPLANT
DRAPE U-SHAPE 47X51 STRL (DRAPES) ×3 IMPLANT
DRSG PAD ABDOMINAL 8X10 ST (GAUZE/BANDAGES/DRESSINGS) ×6 IMPLANT
DURAPREP 26ML APPLICATOR (WOUND CARE) ×3 IMPLANT
ELECT REM PT RETURN 9FT ADLT (ELECTROSURGICAL) ×3
ELECTRODE REM PT RTRN 9FT ADLT (ELECTROSURGICAL) ×1 IMPLANT
FACESHIELD WRAPAROUND (MASK) ×3 IMPLANT
FACESHIELD WRAPAROUND OR TEAM (MASK) ×1 IMPLANT
GAUZE XEROFORM 5X9 LF (GAUZE/BANDAGES/DRESSINGS) ×3 IMPLANT
GLOVE BIOGEL PI IND STRL 7.5 (GLOVE) ×1 IMPLANT
GLOVE BIOGEL PI IND STRL 8 (GLOVE) ×1 IMPLANT
GLOVE BIOGEL PI INDICATOR 7.5 (GLOVE) ×2
GLOVE BIOGEL PI INDICATOR 8 (GLOVE) ×2
GLOVE ECLIPSE 7.0 STRL STRAW (GLOVE) ×3 IMPLANT
GLOVE ORTHO TXT STRL SZ7.5 (GLOVE) ×3 IMPLANT
GOWN STRL REUS W/ TWL LRG LVL3 (GOWN DISPOSABLE) ×3 IMPLANT
GOWN STRL REUS W/ TWL XL LVL3 (GOWN DISPOSABLE) ×1 IMPLANT
GOWN STRL REUS W/TWL LRG LVL3 (GOWN DISPOSABLE) ×9
GOWN STRL REUS W/TWL XL LVL3 (GOWN DISPOSABLE) ×3
HANDPIECE INTERPULSE COAX TIP (DISPOSABLE) ×3
IMMOBILIZER KNEE 22 UNIV (SOFTGOODS) ×3 IMPLANT
KIT BASIN OR (CUSTOM PROCEDURE TRAY) ×3 IMPLANT
KIT ROOM TURNOVER OR (KITS) ×3 IMPLANT
MANIFOLD NEPTUNE II (INSTRUMENTS) ×3 IMPLANT
MARKER SPHERE PSV REFLC THRD 5 (MARKER) ×9 IMPLANT
NDL 1/2 CIR MAYO (NEEDLE) ×1 IMPLANT
NDL HYPO 25GX1X1/2 BEV (NEEDLE) ×1 IMPLANT
NEEDLE 1/2 CIR MAYO (NEEDLE) ×3 IMPLANT
NEEDLE HYPO 25GX1X1/2 BEV (NEEDLE) ×3 IMPLANT
NS IRRIG 1000ML POUR BTL (IV SOLUTION) ×3 IMPLANT
PACK TOTAL JOINT (CUSTOM PROCEDURE TRAY) ×3 IMPLANT
PAD ARMBOARD 7.5X6 YLW CONV (MISCELLANEOUS) ×6 IMPLANT
PAD CAST 4YDX4 CTTN HI CHSV (CAST SUPPLIES) ×1 IMPLANT
PADDING CAST COTTON 4X4 STRL (CAST SUPPLIES) ×3
PADDING CAST COTTON 6X4 STRL (CAST SUPPLIES) ×3 IMPLANT
PIN SCHANZ 4MM 130MM (PIN) ×12 IMPLANT
SET HNDPC FAN SPRY TIP SCT (DISPOSABLE) ×1 IMPLANT
SPONGE GAUZE 4X4 12PLY STER LF (GAUZE/BANDAGES/DRESSINGS) ×2 IMPLANT
STAPLER VISISTAT 35W (STAPLE) ×3 IMPLANT
STRIP CLOSURE SKIN 1/2X4 (GAUZE/BANDAGES/DRESSINGS) ×3 IMPLANT
SUCTION FRAZIER TIP 10 FR DISP (SUCTIONS) ×3 IMPLANT
SUT ETHIBOND NAB CT1 #1 30IN (SUTURE) ×5 IMPLANT
SUT VIC AB 0 CT1 27 (SUTURE) ×3
SUT VIC AB 0 CT1 27XBRD ANBCTR (SUTURE) IMPLANT
SUT VIC AB 2-0 CT1 27 (SUTURE) ×6
SUT VIC AB 2-0 CT1 TAPERPNT 27 (SUTURE) ×2 IMPLANT
SUT VICRYL 4-0 PS2 18IN ABS (SUTURE) ×3 IMPLANT
SUT VICRYL AB 2 0 TIES (SUTURE) ×1 IMPLANT
SYR CONTROL 10ML LL (SYRINGE) ×3 IMPLANT
TOWEL OR 17X24 6PK STRL BLUE (TOWEL DISPOSABLE) ×3 IMPLANT
TOWEL OR 17X26 10 PK STRL BLUE (TOWEL DISPOSABLE) ×3 IMPLANT
TRAY FOLEY CATH 16FRSI W/METER (SET/KITS/TRAYS/PACK) ×3 IMPLANT
TUBE CONNECTING 12'X1/4 (SUCTIONS) ×1
TUBE CONNECTING 12X1/4 (SUCTIONS) ×1 IMPLANT
YANKAUER SUCT BULB TIP NO VENT (SUCTIONS) ×2 IMPLANT

## 2013-12-04 NOTE — Anesthesia Procedure Notes (Signed)
Anesthesia Regional Block:  Adductor canal block  Pre-Anesthetic Checklist: ,, timeout performed, Correct Patient, Correct Site, Correct Laterality, Correct Procedure, Correct Position, site marked, Risks and benefits discussed,  Surgical consent,  Pre-op evaluation,  At surgeon's request and post-op pain management  Laterality: Left  Prep: chloraprep       Needles:  Injection technique: Single-shot  Needle Type: Echogenic Stimulator Needle     Needle Length: 9cm 9 cm Needle Gauge: 22 and 22 G    Additional Needles:  Procedures: ultrasound guided (picture in chart) Adductor canal block Narrative:  Start time: 12/04/2013 10:00 AM End time: 12/04/2013 10:05 AM Injection made incrementally with aspirations every 5 mL.  Performed by: Personally   Additional Notes: 25 cc 0.5% Naropin injected without difficulty, well tolerated

## 2013-12-04 NOTE — Care Management Note (Signed)
CARE MANAGEMENT NOTE 12/04/2013  Patient:  Austin Diaz,Austin Diaz   Account Number:  [id number]  Date Initiated:  12/04/2013  Documentation initiated by:  Vance PeperBRADY,Cason Luffman  Subjective/Objective Assessment:   68 yr old male s/p left total knee arthroplasty.     Action/Plan:   Case manager spoke with patient concerning home health and DME needs at discharge. Choice offered. Referral called to Jodene NamMary Hickling, [facility] liaison. Patient states he has a rolling walker and handicap accessible bathrrom. Has family support .   Anticipated DC Date:  12/06/2013   Anticipated DC Plan:  HOME W HOME HEALTH SERVICES      DC Planning Services  CM consult      [facility]  [facility]   Choice offered to / List presented to:  C-1 Patient   DME arranged  NA        HH arranged  HH-2 PT      Status of service:  In process, will continue to follow Medicare Important Message given?   (If response is "NO", the following Medicare IM given date fields will be blank) Date Medicare IM given:   Medicare IM given by:   Date Additional Medicare IM given:   Additional Medicare IM given by:    Discharge Disposition:    Per UR Regulation:  Reviewed for med. necessity/level of care/duration of stay

## 2013-12-04 NOTE — Progress Notes (Signed)
Orthopedic Tech Progress Note Patient Details:  Terressa KoyanagiWilliam H Dell [birth year] [id number]  CPM Left Knee CPM Left Knee: On Left Knee Flexion (Degrees): 60 Left Knee Extension (Degrees): 0 Additional Comments: Trapeze bar and foot roll   Shawnie PonsCammer, Araf Clugston Carol 12/04/2013, 2:43 PM

## 2013-12-04 NOTE — Anesthesia Preprocedure Evaluation (Addendum)
Anesthesia Evaluation  Patient identified by MRN, date of birth, ID band Patient awake    Reviewed: Allergy & Precautions, H&P , NPO status , [name] Chart, lab work & pertinent test results  Airway Mallampati: II TM Distance: >3 FB Neck ROM: Full    Dental  (+) Edentulous Upper, Dental Advisory Given   Pulmonary Current Smoker,    + decreased breath sounds      Cardiovascular hypertension, Rhythm:Irregular Rate:Normal     Neuro/Psych    GI/Hepatic   Endo/Other  diabetes  Renal/GU      Musculoskeletal   Abdominal   Peds  Hematology   Anesthesia Other Findings   Reproductive/Obstetrics                           Anesthesia Physical Anesthesia Plan  ASA: III  Anesthesia Plan: General   Post-op Pain Management:    Induction: Intravenous  Airway Management Planned: LMA  Additional Equipment:   Intra-op Plan:   Post-operative Plan:   Informed Consent: I have reviewed the patients History and Physical, chart, labs and discussed the procedure including the risks, benefits and alternatives for the proposed anesthesia with the patient or authorized representative who has indicated his/her understanding and acceptance.   Dental advisory given  Plan Discussed with: CRNA, Anesthesiologist and Surgeon  Anesthesia Plan Comments: (DJD L. Knee CAD S/P CABG 02-03-2009 (-) myoview 10/08/13 EF 60% Hypertension Chronic a fib rate controlled Active smoker  Plan GA with adductor canal block and LMA)       Anesthesia Quick Evaluation

## 2013-12-04 NOTE — Transfer of Care (Signed)
Immediate Anesthesia Transfer of Care Note  Patient: [name]  Procedure(s) Performed: Procedure(s) with comments: Left Total Knee Arthroplasty, Cemented, Computer Assist (Left) - Left Total Knee Arthroplasty, Cemented, Computer Assist  Patient Location: PACU  Anesthesia Type:General and GA combined with regional for post-op pain  Level of Consciousness: awake, alert  and oriented  Airway & Oxygen Therapy: Patient Spontanous Breathing and Patient connected to face mask oxygen  Post-op Assessment: Report given to PACU RN  Post vital signs: Reviewed and stable  Complications: No apparent anesthesia complications

## 2013-12-04 NOTE — Progress Notes (Signed)
Utilization review completed. Kalkidan Caudell, RN, BSN. 

## 2013-12-04 NOTE — Evaluation (Signed)
Physical Therapy Evaluation Patient Details Name: Terressa KoyanagiWilliam H Lora MRN: [id number] DOB: 1946-02-10 Today's Date: 12/04/2013   History of Present Illness  68 [facility].o. [facility] s/p elective L TKA.  Pt with significant PMHx of PVD, HTN, MI with CABG, CAD, DM with peripherial neuropathy, multiple bil hip surgeries including bil hip replacements and lumbar surgery.    Clinical Impression  PT is POD #0 and is moving with min assist overall. I anticipate that he will progress well enough to go home with HHPT f/u and intermittent supervision from his daughter.   PT to follow acutely for deficits listed below.       Follow Up Recommendations Home health PT    Equipment Recommendations  Rolling walker with 5" wheels    Recommendations for Other Services       Precautions / Restrictions Precautions Precautions: Knee Required Braces or Orthoses: Knee Immobilizer - Left Knee Immobilizer - Left: On at all times Restrictions Weight Bearing Restrictions: Yes LUE Weight Bearing: Weight bearing as tolerated      Mobility  Bed Mobility Overal bed mobility: Needs Assistance Bed Mobility: Supine to Sit     Supine to sit: Min assist     General bed mobility comments: Min assist to support left leg when going to EOB.  Pt using bed rail for leverage  Transfers Overall transfer level: Needs assistance Equipment used: Rolling walker (2 wheeled) Transfers: Sit to/from Stand Sit to Stand: Min assist         General transfer comment: Min assist to support trunk during transitions, Verbal cues for safe hand placement and leg placement when going to sitting.   Ambulation/Gait Ambulation/Gait assistance: Min assist Ambulation Distance (Feet): 8 Feet Assistive device: Rolling walker (2 wheeled) Gait Pattern/deviations: Step-to pattern;Antalgic     General Gait Details: Min assist to support trunk when WB through left leg and stepping with R leg.  Verbal cues for correct  leg sequencing.          Balance Overall balance assessment: Needs assistance Sitting-balance support: Feet supported;No upper extremity supported Sitting balance-Leahy Scale: Good     Standing balance support: Bilateral upper extremity supported Standing balance-Leahy Scale: Poor Standing balance comment: needs external support to stand                             Pertinent Vitals/Pain See vitals flow sheet.     Home Living Family/patient expects to be discharged to:: Private residence Living Arrangements: Alone Available Help at Discharge: Family;Available PRN/intermittently (daughter is not far from him) Type of Home: House Home Access: Level entry     Home Layout: One level Home Equipment: Crutches;Cane - single point;Walker - standard      Prior Function Level of Independence: Independent         Comments: drives, does not work     Higher education careers adviserHand Dominance   Dominant Hand: Right    Extremity/Trunk Assessment   Upper Extremity Assessment: Defer to OT evaluation           Lower Extremity Assessment: LLE deficits/detail   LLE Deficits / Details: normal post-op weakness, ankle WNL, knee 2+/5, hip flexion 2+/5  Cervical / Trunk Assessment: Other exceptions  Communication   Communication: No difficulties  Cognition Arousal/Alertness: Awake/alert Behavior During Therapy: WFL for tasks assessed/performed Overall Cognitive Status: Within Functional Limits for tasks assessed  Exercises Total Joint Exercises Ankle Circles/Pumps: AROM;Both;10 reps      Assessment/Plan    PT Assessment Patient needs continued PT services  PT Diagnosis Difficulty walking;Abnormality of gait;Generalized weakness;Acute pain   PT Problem List Decreased strength;Decreased range of motion;Decreased activity tolerance;Decreased balance;Decreased mobility;Decreased knowledge of use of DME;Decreased knowledge of precautions;Pain  PT  Treatment Interventions DME instruction;Gait training;Functional mobility training;Therapeutic activities;Therapeutic exercise;Balance training;Neuromuscular re-education;Patient/family education;Modalities   PT Goals (Current goals can be found in the Care Plan section) Acute Rehab PT Goals Patient Stated Goal: to be able to function at home with just his daughter checking in on him PT Goal Formulation: With patient Time For Goal Achievement: 12/11/13 Potential to Achieve Goals: Good    Frequency 7X/week    End of Session Equipment Utilized During Treatment: Gait belt;Left knee immobilizer Activity Tolerance: Patient limited by pain Patient left: in chair;with call bell/phone within reach           Time: 1717-1735 PT Time Calculation (min): 18 min   Charges:   PT Evaluation $Initial PT Evaluation Tier I: 1 Procedure PT Treatments $Therapeutic Activity: 8-22 mins        Tamyia Minich B. Morio Widen, PT, DPT [phone]   12/04/2013, 11:21 PM

## 2013-12-04 NOTE — Discharge Instructions (Signed)
Keep knee incision dry for 5 days post op then may wet while bathing. Therapy daily and goal full extension and greater than 90 degrees flexion. Call if fever or chills or increased drainage. Go to ER if acutely short of breath or call for ambulance. Return for follow up in 2 weeks. May full weight bear on the surgical leg unless told otherwise. Use knee immobilizer until able to straight leg raise off bed with knee stable. In house walking for first 2 weeks. Ice packs daily for pain and swelling

## 2013-12-04 NOTE — Anesthesia Postprocedure Evaluation (Signed)
  Anesthesia Post-op Note  Patient: Terressa KoyanagiWilliam H Mask  Procedure(s) Performed: Procedure(s) with comments: Left Total Knee Arthroplasty, Cemented, Computer Assist (Left) - Left Total Knee Arthroplasty, Cemented, Computer Assist  Patient Location: PACU  Anesthesia Type:General and GA combined with regional for post-op pain  Level of Consciousness: awake, alert  and oriented  Airway and Oxygen Therapy: Patient Spontanous Breathing and Patient connected to nasal cannula oxygen  Post-op Pain: mild  Post-op Assessment: Post-op Vital signs reviewed, [name] Cardiovascular Status Stable, Respiratory Function Stable, Patent Airway and Pain level controlled  Post-op Vital Signs: stable  Last Vitals:  Filed Vitals:   12/04/13 1420  BP: 111/66  Pulse: 92  Temp: 36.4 C  Resp: 18    Complications: No apparent anesthesia complications

## 2013-12-04 NOTE — Interval H&P Note (Signed)
History and Physical Interval Note:  12/04/2013 9:19 AM  Austin Diaz  has presented today for surgery, with the diagnosis of Left Knee Osteoarthritis  The various methods of treatment have been discussed with the patient and family. After consideration of risks, benefits and other options for treatment, the patient has consented to  Procedure(s) with comments: Left Total Knee Arthroplasty, Cemented, Computer Assist (Left) - Left Total Knee Arthroplasty, Cemented, Computer Assist as a surgical intervention .  The patient's history has been reviewed, patient examined, no change in status, stable for surgery.  I have reviewed the patient's chart and labs.  Questions were answered to the patient's satisfaction.     Oceana Walthall C

## 2013-12-04 NOTE — Brief Op Note (Cosign Needed)
12/04/2013  12:49 PM  PATIENT:  Austin KoyanagiWilliam H Bouza  68 y.o. male  PRE-OPERATIVE DIAGNOSIS:  Left Knee Osteoarthritis  POST-OPERATIVE DIAGNOSIS:  Left Knee Osteoarthritis  PROCEDURE:  Procedure(s) with comments: Left Total Knee Arthroplasty, Cemented, Computer Assist (Left) - Left Total Knee Arthroplasty, Cemented, Computer Assist  SURGEON:  Surgeon(s) and Role:    * Eldred MangesMark C Yates, MD - Primary  PHYSICIAN ASSISTANT: Maud DeedSheila Frimet [facility]  ASSISTANTS: none   ANESTHESIA:   general  EBL:  Total I/O In: 1300 [I.V.:1300] Out: 50 [Blood:50]  BLOOD ADMINISTERED:none  DRAINS: none   LOCAL MEDICATIONS USED:  NONE  SPECIMEN:  No Specimen  DISPOSITION OF SPECIMEN:  N/A  COUNTS:  YES  TOURNIQUET:   Total Tourniquet Time Documented: Thigh (Left) - 89 minutes Total: Thigh (Left) - 89 minutes   DICTATION: .Note written in EPIC  PLAN OF CARE: Admit to inpatient   PATIENT DISPOSITION:  PACU - hemodynamically stable.   Delay start of Pharmacological VTE agent (>24hrs) due to surgical blood loss or risk of bleeding: no

## 2013-12-05 LAB — BASIC METABOLIC PANEL
ANION GAP: 10 (ref 5–15)
BUN: 10 mg/dL (ref 6–23)
CHLORIDE: 99 meq/L (ref 96–112)
CO2: 27 mEq/L (ref 19–32)
Calcium: 8.1 mg/dL — ABNORMAL LOW (ref 8.4–10.5)
Creatinine, Ser: 1.21 mg/dL (ref 0.50–1.35)
GFR calc Af Amer: 69 mL/min — ABNORMAL LOW (ref >90)
GFR calc non Af Amer: 60 mL/min — ABNORMAL LOW (ref >90)
Glucose, Bld: 131 mg/dL — ABNORMAL HIGH (ref 70–99)
Potassium: 3.7 mEq/L (ref 3.7–5.3)
SODIUM: 136 meq/L — AB (ref 137–147)

## 2013-12-05 LAB — GLUCOSE, CAPILLARY
GLUCOSE-CAPILLARY: 126 mg/dL — AB (ref 70–99)
Glucose-Capillary: 129 mg/dL — ABNORMAL HIGH (ref 70–99)
Glucose-Capillary: 156 mg/dL — ABNORMAL HIGH (ref 70–99)
Glucose-Capillary: 117 mg/dL — ABNORMAL HIGH (ref 70–99)

## 2013-12-05 LAB — CBC
HCT: 32.5 % — ABNORMAL LOW (ref 39.0–52.0)
Hemoglobin: 11 g/dL — ABNORMAL LOW (ref 13.0–17.0)
MCH: 32.4 pg (ref 26.0–34.0)
MCHC: 33.8 g/dL (ref 30.0–36.0)
MCV: 95.9 fL (ref 78.0–100.0)
Platelets: 110 10*3/uL — ABNORMAL LOW (ref 150–400)
RBC: 3.39 MIL/uL — ABNORMAL LOW (ref 4.22–5.81)
RDW: 13.6 % (ref 11.5–15.5)
WBC: 6.1 10*3/uL (ref 4.0–10.5)

## 2013-12-05 LAB — HEMOGLOBIN A1C
HEMOGLOBIN A1C: 5.7 % — AB (ref <5.7)
MEAN PLASMA GLUCOSE: 117 mg/dL — AB (ref <117)

## 2013-12-05 NOTE — Op Note (Signed)
NAME:  Austin Diaz, [name]             ACCOUNT NO.:  [id number]  MEDICAL RECORD NO.:  [id number]  LOCATION:  [id number]                        FACILITY:  [facility]  PHYSICIAN:  Karlene Southard C. Ophelia CharterYates, M.D.    DATE OF BIRTH:  07/06/1945  DATE OF PROCEDURE:  12/04/2013 DATE OF DISCHARGE:                              OPERATIVE REPORT   PREOPERATIVE DIAGNOSIS:  Left knee osteoarthritis.  POSTOPERATIVE DIAGNOSIS:  Left knee osteoarthritis.  PROCEDURE:  Left total knee arthroplasty.  SURGEON:  Zyra Parrillo C. Ophelia CharterYates, M.D.  ASSISTANT:  Wende NeighborsSheila M. Vernon, PA-C, medically necessary and present for the entire procedure.  ESTIMATED BLOOD LOSS:  Minimal.  ANESTHESIA:  General plus adductor regional block.  COMPLICATIONS:  None.  COMPONENTS:  LCS DePuy rotating platform, #5 femur, #4 tibia, 10-mm spacer, 41-mm patellar 3-pegged button, all cemented.  DESCRIPTION OF PROCEDURE:  After induction of general anesthesia, orotracheal intubation, Ancef prophylaxis, time-out procedure, prepped with DuraPrep, and the tourniquet to tip of toes, usual total knee sheath drapes, sterile skin marker, Betadine, Steri-Drape, Coban, over stockinette were applied.  Leg was wrapped with an Esmarch, tourniquet inflated.  Midline incision was made.  Medial parapatellar incision splitting the quad tendon between the middle 1/3rd and lateral 2/3rd. Patella was reverted, 10 mm was taken off the patella for resurfacing from facet to facet.  Pins were placed in the femur.  Stab incision in mid tibia and computer models were generated.  The patient had valgus of 30 degrees and a 28-degree knee flexion contracture.  Pin was planned, resection on the femur after pinning.  Actual resection was 8.6 mm. Chamfer cuts were made.  AP cut was made on the tibia, taking pin and an additional 2 mm was taken.  Once trials were inserted which were tight, we then went back to the femur, cut off an additional 5 mm on the femur distally which allowed full  extension of the knee, good flexion- extension balance, and no varus or valgus malalignment.  Pulsatile lavage was used.  Cement vacuum mixed.  Tibia cemented first followed by femur, placement of the permanent 10-mm spacer, and the patellar button was cemented and held with a clamp.  Knee reached full extension.  Collateral ligaments were balanced.  All excess cement was removed.  Tourniquet was deflated prior to closure.  Standard closure quad tendon and superficial retinaculum, subcuticular skin closure.  Steri-Strips, postop dressing, and knee immobilizer.  The patient tolerated the procedure well.     Caidon Foti C. Ophelia CharterYates, M.D.     MCY/MEDQ  D:  12/04/2013  T:  12/04/2013  Job:  [id number]

## 2013-12-05 NOTE — Progress Notes (Signed)
Patient ID: Austin KoyanagiWilliam H Diaz, male   DOB: 1946/04/10, 68 y.o.   MRN: [id number] Postoperative day 1 total knee arthroplasty. Patient is in a CPM this morning. Patient states that he is comfortable. Continue physical therapy. Possible discharge to home on Sunday.

## 2013-12-05 NOTE — Progress Notes (Signed)
Physical Therapy Treatment Patient Details Name: Austin Diaz MRN: [id number] DOB: 10-Oct-1945 Today's Date: 12/05/2013    History of Present Illness 68 y.o. male admitted to [facility] on 12/04/13 s/p elective L TKA.  Pt with significant PMHx of PVD, HTN, MI with CABG, CAD, DM with peripherial neuropathy, multiple bil hip surgeries including bil hip replacements and lumbar surgery.      PT Comments    Progressing well with mobility. Pt more reserved about possibility of DC home today. He xis having more pain this pm.   Follow Up Recommendations  Home health PT     Equipment Recommendations  Rolling walker with 5" wheels    Recommendations for Other Services       Precautions / Restrictions Precautions Precautions: Knee Precaution Comments: educated on precautions Required Braces or Orthoses: Knee Immobilizer - Left Knee Immobilizer - Left: On at all times Restrictions Weight Bearing Restrictions: Yes LUE Weight Bearing:  (LLE WBAT) LLE Weight Bearing: Weight bearing as tolerated    Mobility  Bed Mobility Overal bed mobility: Needs Assistance Bed Mobility: Supine to Sit     Supine to sit: Min assist     General bed mobility comments: Min A for LLE. pt used rail with left hand  Transfers Overall transfer level: Needs assistance Equipment used: Rolling walker (2 wheeled) Transfers: Sit to/from Stand Sit to Stand: Min guard         General transfer comment: pt used correct technique  Ambulation/Gait Ambulation/Gait assistance: Min guard Ambulation Distance (Feet): 80 Feet Assistive device: Rolling walker (2 wheeled) Gait Pattern/deviations: Step-to pattern;Decreased stride length Gait velocity: decreased from normal   General Gait Details: cues to not get too close to front of RW   Stairs            Wheelchair Mobility    Modified Rankin (Stroke Patients Only)       Balance                                    Cognition  Arousal/Alertness: Awake/alert Behavior During Therapy: WFL for tasks assessed/performed Overall Cognitive Status: Within Functional Limits for tasks assessed                      Exercises      General Comments General comments (skin integrity, edema, etc.): Pt's dAughter present for start and end of session.  with pts permission updated daughter on pts progress and discussed how to assist him with bed mobility.      Pertinent Vitals/Pain 8-9/10 pain in Left knee.  RN gave meds during session and pt positioned comfortably at end of session. Advised pt to ask for pain meds before pain got to such a high level.     Home Living Family/patient expects to be discharged to:: Private residence Living Arrangements: Alone Available Help at Discharge: Family;Available PRN/intermittently (daughter is not far from him; states she will be with him) Type of Home: House Home Access: Level entry   Home Layout: One level Home Equipment: Crutches;Cane - single point;Walker - standard      Prior Function Level of Independence: Independent      Comments: drives, does not work   PT Goals (current goals can now be found in the care plan section) Acute Rehab PT Goals Patient Stated Goal: not stated Progress towards PT goals: Progressing toward goals    Frequency  PT Plan Current plan remains appropriate    Co-evaluation             End of Session Equipment Utilized During Treatment: Gait belt;Left knee immobilizer Activity Tolerance: Patient limited by pain Patient left: with call bell/phone within reach;in bed;with family/visitor present     Time: 1610-96041519-1537 PT Time Calculation (min): 18 min  Charges:  $Gait Training: 8-22 mins                    G Codes:      Donnella ShamSawulski, Teren Zurcher J 12/05/2013, 3:45 PM Austin Diaz, PT  [phone] 12/05/2013

## 2013-12-05 NOTE — Evaluation (Signed)
Occupational Therapy Evaluation Patient Details Name: Austin Diaz MRN: [id number] DOB: March 19, 1946 Today's Date: 12/05/2013    History of Present Illness 68 y.o. male admitted to [facility] on 12/04/13 s/p elective L TKA.  Pt with significant PMHx of PVD, HTN, MI with CABG, CAD, DM with peripherial neuropathy, multiple bil hip surgeries including bil hip replacements and lumbar surgery.     Clinical Impression   Pt s/p above. Pt independent with ADLs, PTA. Feel pt may benefit from acute OT to increase independence prior to d/c.     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)    Recommendations for [employer]       Precautions / Restrictions Precautions Precautions: Knee Precaution Comments: educated on precautions Required Braces or Orthoses: Knee Immobilizer - Left Knee Immobilizer - Left: On at all times Restrictions Weight Bearing Restrictions: Yes LLE Weight Bearing: Weight bearing as tolerated      Mobility   Bed mobility General bed mobility comments: not assessed  Transfers Overall transfer level: Needs assistance Equipment used: Rolling walker (2 wheeled) Transfers: Sit to/from Stand Sit to Stand: Min guard;Min assist         General transfer comment: cues for technique.; Min A to sit on simulated shower chair to help guide him down    Balance                                            ADL Overall ADL's : Needs assistance/impaired                 Upper Body Dressing : Set up;Sitting   Lower Body Dressing: Sit to/from stand;Minimal assistance;With adaptive equipment   Toilet Transfer: Min guard;Ambulation;RW (chair)       Tub/ Shower Transfer: Minimal assistance;Ambulation;Rolling walker;Shower seat   Functional mobility during ADLs: Min guard;Rolling walker General ADL Comments:  Educated on dressing technique as well as AE for LB ADLs. Pt practiced with reacher/sockaid. Educated on safety tips for home (sitting for most of LB ADLs, rugs, use of bag on walker). Recommended someone be with him for shower transfer. Educated on different options for shower chair as well as different techniques. Recommended pt not step over tub for a while.  Told pt that family could use regular belt around him if they would like for safety. Pt practiced simulated tub transfer by backing to chair and swinging legs in. Discussed knee immobilizer wear and what footsie roll is used for.      Vision                     Perception     Praxis      Pertinent Vitals/Pain Pain 6/10. Increased activity during session.     Hand Dominance Right   Extremity/Trunk Assessment Upper Extremity Assessment Upper Extremity Assessment: Overall WFL for tasks assessed   Lower Extremity Assessment Lower Extremity Assessment: Defer to PT evaluation       Communication Communication Communication: No difficulties   Cognition Arousal/Alertness: Awake/alert Behavior During Therapy: WFL for tasks assessed/performed Overall Cognitive Status: Within Functional Limits for tasks assessed                     General Comments  Shoulder Instructions      Home Living Family/patient expects to be discharged to:: Private residence Living Arrangements: Alone Available Help at Discharge: Family;Available PRN/intermittently (daughter is not far from him; states she will be with him most of the time) Type of Home: House Home Access: Level entry     Home Layout: One level     Bathroom Shower/Tub: Tub/shower unit;Walk-in shower   Bathroom Toilet: Handicapped height     Home Equipment: Crutches;Cane - single point;Walker - standard          Prior Functioning/Environment Level of Independence: Independent        Comments: drives, does not work    OT Diagnosis: Acute  pain   OT Problem List: Decreased strength;Decreased range of motion;Decreased knowledge of use of DME or AE;Decreased knowledge of precautions;Pain   OT Treatment/Interventions: Self-care/ADL training;DME and/or AE instruction;Therapeutic activities;Patient/family education;Balance training    OT Goals(Current goals can be found in the care plan section) Acute Rehab OT Goals Patient Stated Goal: not stated OT Goal Formulation: With patient Time For Goal Achievement: 12/12/13 Potential to Achieve Goals: Good  OT Frequency: Min 2X/week   Barriers to D/C:            Co-evaluation              End of Session Equipment Utilized During Treatment: Gait belt;Rolling walker;Left knee immobilizer  Activity Tolerance: Patient tolerated treatment well Patient left: in chair;with call bell/phone within reach   Time: [id number] OT Time Calculation (min): 25 min Charges:  OT General Charges $OT Visit: 1 Procedure OT Evaluation $Initial OT Evaluation Tier I: 1 Procedure OT Treatments $Self Care/Home Management : 8-22 mins G-CodesEarlie Raveling:    Lemoyne Scarpati L OTR/L [phone] 12/05/2013, 1:08 PM

## 2013-12-05 NOTE — Progress Notes (Signed)
Physical Therapy Treatment Patient Details Name: Austin KoyanagiWilliam H Yeatts MRN: [id number] DOB: [birth year] Today's Date: 12/05/2013    History of Present Illness 68 y.o. male [facility] 12/04/13 s/p elective L TKA.  Pt with significant PMHx of PVD, HTN, MI with CABG, CAD, DM with peripherial neuropathy, multiple bil hip surgeries including bil hip replacements and lumbar surgery.      PT Comments    Pt progressing well. He is hopeful for DC tomorrow with daughter to assist.    Follow Up Recommendations  Home health PT     Equipment Recommendations  Rolling walker with 5" wheels    Recommendations for Other Services       Precautions / Restrictions Precautions Precautions: Knee Required Braces or Orthoses: Knee Immobilizer - Left Knee Immobilizer - Left: On at all times Restrictions Weight Bearing Restrictions: Yes LUE Weight Bearing: Weight bearing as tolerated (LLE WBAT)    Mobility  Bed Mobility Overal bed mobility: Needs Assistance Bed Mobility: Supine to Sit     Supine to sit: Min assist     General bed mobility comments: Min A for LLE and for upper body without using rails  Transfers Overall transfer level: Needs assistance Equipment used: Rolling walker (2 wheeled) Transfers: Sit to/from Stand Sit to Stand: Min assist         General transfer comment: Cues for hand placement and to place LLE out in front when sitting  Ambulation/Gait Ambulation/Gait assistance: Min assist Ambulation Distance (Feet): 35 Feet Assistive device: Rolling walker (2 wheeled) Gait Pattern/deviations: Step-to pattern;Decreased stride length Gait velocity: decreased from normal   General Gait Details: cues for sequencing and to not get too close to f ront of RW   Stairs            Wheelchair Mobility    Modified Rankin (Stroke Patients Only)       Balance                                    Cognition Arousal/Alertness: Awake/alert Behavior  During Therapy: WFL for tasks assessed/performed Overall Cognitive Status: Within Functional Limits for tasks assessed                      Exercises Total Joint Exercises Ankle Circles/Pumps: AROM;Both;10 reps Quad Sets: AROM;Left;10 reps;Supine Heel Slides: AAROM;Left;10 reps;Supine Hip ABduction/ADduction: AAROM;Left;10 reps;Supine Straight Leg Raises: AAROM;Left;10 reps;Supine Long Arc Quad: AAROM;Left;10 reps;Seated Knee Flexion: AAROM;Left;5 reps;Seated Goniometric ROM: 85 degrees flwxion in sitting    General Comments General comments (skin integrity, edema, etc.): pt motivated, wants to return home tomorrow      Pertinent Vitals/Pain Pt c/o minimal pain in left knee, even with exercises. Had pain meds from RN during session.     Home Living                      Prior Function            PT Goals (current goals can now be found in the care plan section) Progress towards PT goals: Progressing toward goals    Frequency       PT Plan Current plan remains appropriate    Co-evaluation             End of Session Equipment Utilized During Treatment: Gait belt;Left knee immobilizer Activity Tolerance: Patient tolerated treatment well Patient left: in chair;with call bell/phone within  reach.  Leg placed in full extension with towel roll under ankle.      Time: 1610-9604 PT Time Calculation (min): 31 min  Charges:  $Gait Training: 8-22 mins $Therapeutic Exercise: 8-22 mins                    G Codes:      Donnella Sham Dec 26, 2013, 9:38 AM [place]ona [place]ound, PT  [phone] 2013/12/26

## 2013-12-05 NOTE — Progress Notes (Signed)
Subjective: 1 Day Post-Op Procedure(s) (LRB): Left Total Knee Arthroplasty, Cemented, Computer Assist (Left) Patient reports pain as moderate.    Objective: Vital signs in last 24 hours: Temp:  [97.1 F (36.2 C)-98.6 F (37 C)] 98.6 F (37 C) (08/01 0522) Pulse Rate:  [59-92] 76 (08/01 0522) Resp:  [10-32] 20 (08/01 0522) BP: (82-169)/(40-108) 135/40 mmHg (08/01 0522) SpO2:  [91 %-98 %] 95 % (08/01 0522)  Intake/Output from previous day: 07/31 0701 - 08/01 0700 In: 3200 [P.O.:1800; I.V.:1400] Out: 500 [Urine:450; Blood:50] Intake/Output this shift: Total I/O In: 240 [P.O.:240] Out: -    Recent Labs  12/05/13 0415  HGB 11.0*    Recent Labs  12/05/13 0415  WBC 6.1  RBC 3.39*  HCT 32.5*  PLT 110*    Recent Labs  12/05/13 0415  NA 136*  K 3.7  CL 99  CO2 27  BUN 10  CREATININE 1.21  GLUCOSE 131*  CALCIUM 8.1*   No results found for this basename: LABPT, INR,  in the last 72 hours  Neurologically intact  Assessment/Plan: 1 Day Post-Op Procedure(s) (LRB): Left Total Knee Arthroplasty, Cemented, Computer Assist (Left) Up with therapy  Austin Diaz C 12/05/2013, 10:00 AM

## 2013-12-06 LAB — CBC
HCT: 29.7 % — ABNORMAL LOW (ref 39.0–52.0)
Hemoglobin: 9.8 g/dL — ABNORMAL LOW (ref 13.0–17.0)
MCH: 31.3 pg (ref 26.0–34.0)
MCHC: 33 g/dL (ref 30.0–36.0)
MCV: 94.9 fL (ref 78.0–100.0)
PLATELETS: 120 10*3/uL — AB (ref 150–400)
RBC: 3.13 MIL/uL — ABNORMAL LOW (ref 4.22–5.81)
RDW: 13.8 % (ref 11.5–15.5)
WBC: 7.9 10*3/uL (ref 4.0–10.5)

## 2013-12-06 LAB — GLUCOSE, CAPILLARY
GLUCOSE-CAPILLARY: 103 mg/dL — AB (ref 70–99)
Glucose-Capillary: 121 mg/dL — ABNORMAL HIGH (ref 70–99)

## 2013-12-06 NOTE — Care Management Note (Signed)
    Page 1 of 2   12/06/2013     1:10:58 PM CARE MANAGEMENT NOTE 12/06/2013  Patient:  Austin Diaz,Austin H   Account Number:  [id number]  Date Initiated:  12/04/2013  Documentation initiated by:  Vance PeperBRADY,SUSAN  Subjective/Objective Assessment:   68 yr old male s/p left total knee arthroplasty.     Action/Plan:   Case manager spoke with patient concerning home health and DME needs at discharge. Choice offered. Referral called to Jodene NamMary Hickling, [facility]. Patient states he has a rolling walker and handicap accessible bathrrom. Has family support .   Anticipated DC Date:  12/06/2013   Anticipated DC Plan:  HOME W HOME HEALTH SERVICES      DC Planning Services  CM consult      [facility]   Choice offered to / List presented to:  C-1 Patient   DME arranged  [facility]      DME agency  [facility]     HH arranged  [facility]      [facility]   Status of service:  Completed, signed off Medicare Important Message given?  NA - LOS <3 / Initial given by admissions (If response is "NO", the following Medicare IM given date fields will be blank) Date Medicare IM given:  12/05/2013 Medicare IM given by:   Date Additional Medicare IM given:   Additional Medicare IM given by:    Discharge Disposition:  HOME W HOME HEALTH SERVICES  Per UR Regulation:  Reviewed for med. necessity/level of care/duration of stay  If discussed at Long Length of Stay Meetings, dates discussed:    Comments:  12/06/13 13:00 CM called DME rep for RW to be delivered to room prior to discharge.Called [facility] rep Baxter HireKristen to notify of dischage today and need [facility]. No other CM needs were comunicated.  Austin Diaz, BSN, CM [phone].

## 2013-12-06 NOTE — Progress Notes (Signed)
Patient ID: Austin KoyanagiWilliam H Diaz, male   DOB: October 20, 1945, 68 y.o.   MRN: [id number] Postoperative day 2 total knee arthroplasty. Plan for dressing changed today to a Mepilex dressing. Continued CPM continue therapy.

## 2013-12-06 NOTE — Progress Notes (Signed)
Subjective: 2 Days Post-Op Procedure(s) (LRB): Left Total Knee Arthroplasty, Cemented, Computer Assist (Left) Patient reports pain as moderate.    Objective: Vital signs in last 24 hours: Temp:  [98.9 F (37.2 C)-99.7 F (37.6 C)] 99.2 F (37.3 C) (08/02 0610) Pulse Rate:  [76-79] 77 (08/02 0610) Resp:  [12-22] 16 (08/02 0610) BP: (101-152)/(39-59) 101/59 mmHg (08/02 0610) SpO2:  [91 %-97 %] 93 % (08/02 0610)  Intake/Output from previous day: 08/01 0701 - 08/02 0700 In: 600 [P.O.:600] Out: 2200 [Urine:2200] Intake/Output this shift:     Recent Labs  12/05/13 0415 12/06/13 0312  HGB 11.0* 9.8*    Recent Labs  12/05/13 0415 12/06/13 0312  WBC 6.1 7.9  RBC 3.39* 3.13*  HCT 32.5* 29.7*  PLT 110* 120*    Recent Labs  12/05/13 0415  NA 136*  K 3.7  CL 99  CO2 27  BUN 10  CREATININE 1.21  GLUCOSE 131*  CALCIUM 8.1*   No results found for this basename: LABPT, INR,  in the last 72 hours  Neurologically intact  Assessment/Plan: 2 Days Post-Op Procedure(s) (LRB): Left Total Knee Arthroplasty, Cemented, Computer Assist (Left) Discharge home with home health  Austin Diaz C 12/06/2013, 9:25 AM

## 2013-12-06 NOTE — Progress Notes (Signed)
Physical Therapy Treatment Patient Details Name: Austin KoyanagiWilliam H Fisher MRN: [id number] DOB: [birth year] Today's Date: 12/06/2013    History of Present Illness 68 y.o. male admitted to [facility] on 12/04/13 s/p elective L TKA.  Pt with significant PMHx of PVD, HTN, MI with CABG, CAD, DM with peripherial neuropathy, multiple bil hip surgeries including bil hip replacements and lumbar surgery.      PT Comments    Pt progressing with mobility.  Cont's to report Lt knee pain but better controlled today.  Will cont to follow acutely & with current POC.     Follow Up Recommendations  Home health PT     Equipment Recommendations  Rolling walker with 5" wheels    Recommendations for Other Services       Precautions / Restrictions Precautions Precautions: Knee Required Braces or Orthoses: Knee Immobilizer - Left Knee Immobilizer - Left: On at all times Restrictions LLE Weight Bearing: Weight bearing as tolerated    Mobility  Bed Mobility Overal bed mobility: Needs Assistance Bed Mobility: Supine to Sit     Supine to sit: Min assist     General bed mobility comments: (A) for RLE management to EOB.  Instructed pt in hooking technique to help assist with LE  Transfers Overall transfer level: Needs assistance Equipment used: Rolling walker (2 wheeled) Transfers: Sit to/from Stand Sit to Stand: Supervision         General transfer comment: demonstrates safe hand placement & technique  Ambulation/Gait Ambulation/Gait assistance: Min guard Ambulation Distance (Feet): 80 Feet Assistive device: Rolling walker (2 wheeled) Gait Pattern/deviations: Step-to pattern;Decreased step length - right;Decreased weight shift to left Gait velocity: decreased   General Gait Details: Cues for increased heel strike LLE & encouragement to increase Film/video editorWBing    Stairs            Wheelchair Mobility    Modified Rankin (Stroke Patients Only)       Balance                                    Cognition Arousal/Alertness: Awake/alert Behavior During Therapy: WFL for tasks assessed/performed Overall Cognitive Status: Within Functional Limits for tasks assessed                      Exercises Total Joint Exercises Ankle Circles/Pumps: AROM;Both;10 reps Quad Sets: AROM;Strengthening;Both;10 reps Straight Leg Raises: AAROM;Strengthening;Left;10 reps    General Comments        Pertinent Vitals/Pain 6/10 Lt knee.  Premedicated.      Home Living                      Prior Function            PT Goals (current goals can now be found in the care plan section) Acute Rehab PT Goals Patient Stated Goal: not stated PT Goal Formulation: With patient Time For Goal Achievement: 12/11/13 Potential to Achieve Goals: Good Progress towards PT goals: Progressing toward goals    Frequency  7X/week    PT Plan Current plan remains appropriate    Co-evaluation             End of Session Equipment Utilized During Treatment: Left knee immobilizer Activity Tolerance: Patient tolerated treatment well Patient left: in chair;with call bell/phone within reach     Time: 0810-0833 PT Time Calculation (min): 23 min  Charges:  $Gait Training:  8-22 mins $Therapeutic Activity: 8-22 mins                    G Codes:      Lara Mulch 12/06/2013, 12:52 PM  Verdell Face, [place] [phone] 12/06/2013

## 2013-12-07 ENCOUNTER — Encounter (HOSPITAL_COMMUNITY): Payer: Self-pay | Admitting: Orthopaedic Surgery

## 2013-12-07 DIAGNOSIS — Z471 Aftercare following joint replacement surgery: Secondary | ICD-10-CM | POA: Diagnosis not present

## 2013-12-07 DIAGNOSIS — F17200 Nicotine dependence, unspecified, uncomplicated: Secondary | ICD-10-CM | POA: Diagnosis not present

## 2013-12-07 DIAGNOSIS — I10 Essential (primary) hypertension: Secondary | ICD-10-CM | POA: Diagnosis not present

## 2013-12-07 DIAGNOSIS — E119 Type 2 diabetes mellitus without complications: Secondary | ICD-10-CM | POA: Diagnosis not present

## 2013-12-07 DIAGNOSIS — M159 Polyosteoarthritis, unspecified: Secondary | ICD-10-CM | POA: Diagnosis not present

## 2013-12-07 DIAGNOSIS — IMO0001 Reserved for inherently not codable concepts without codable children: Secondary | ICD-10-CM | POA: Diagnosis not present

## 2013-12-07 DIAGNOSIS — M1710 Unilateral primary osteoarthritis, unspecified knee: Secondary | ICD-10-CM | POA: Diagnosis not present

## 2013-12-08 DIAGNOSIS — E119 Type 2 diabetes mellitus without complications: Secondary | ICD-10-CM | POA: Diagnosis not present

## 2013-12-08 DIAGNOSIS — IMO0001 Reserved for inherently not codable concepts without codable children: Secondary | ICD-10-CM | POA: Diagnosis not present

## 2013-12-08 DIAGNOSIS — F17200 Nicotine dependence, unspecified, uncomplicated: Secondary | ICD-10-CM | POA: Diagnosis not present

## 2013-12-08 DIAGNOSIS — M159 Polyosteoarthritis, unspecified: Secondary | ICD-10-CM | POA: Diagnosis not present

## 2013-12-08 DIAGNOSIS — Z471 Aftercare following joint replacement surgery: Secondary | ICD-10-CM | POA: Diagnosis not present

## 2013-12-08 DIAGNOSIS — I10 Essential (primary) hypertension: Secondary | ICD-10-CM | POA: Diagnosis not present

## 2013-12-09 DIAGNOSIS — M159 Polyosteoarthritis, unspecified: Secondary | ICD-10-CM | POA: Diagnosis not present

## 2013-12-09 DIAGNOSIS — F17200 Nicotine dependence, unspecified, uncomplicated: Secondary | ICD-10-CM | POA: Diagnosis not present

## 2013-12-09 DIAGNOSIS — IMO0001 Reserved for inherently not codable concepts without codable children: Secondary | ICD-10-CM | POA: Diagnosis not present

## 2013-12-09 DIAGNOSIS — Z471 Aftercare following joint replacement surgery: Secondary | ICD-10-CM | POA: Diagnosis not present

## 2013-12-09 DIAGNOSIS — E119 Type 2 diabetes mellitus without complications: Secondary | ICD-10-CM | POA: Diagnosis not present

## 2013-12-09 DIAGNOSIS — I10 Essential (primary) hypertension: Secondary | ICD-10-CM | POA: Diagnosis not present

## 2013-12-11 DIAGNOSIS — F17200 Nicotine dependence, unspecified, uncomplicated: Secondary | ICD-10-CM | POA: Diagnosis not present

## 2013-12-11 DIAGNOSIS — Z471 Aftercare following joint replacement surgery: Secondary | ICD-10-CM | POA: Diagnosis not present

## 2013-12-11 DIAGNOSIS — M159 Polyosteoarthritis, unspecified: Secondary | ICD-10-CM | POA: Diagnosis not present

## 2013-12-11 DIAGNOSIS — E119 Type 2 diabetes mellitus without complications: Secondary | ICD-10-CM | POA: Diagnosis not present

## 2013-12-11 DIAGNOSIS — IMO0001 Reserved for inherently not codable concepts without codable children: Secondary | ICD-10-CM | POA: Diagnosis not present

## 2013-12-11 DIAGNOSIS — I10 Essential (primary) hypertension: Secondary | ICD-10-CM | POA: Diagnosis not present

## 2013-12-15 DIAGNOSIS — E119 Type 2 diabetes mellitus without complications: Secondary | ICD-10-CM | POA: Diagnosis not present

## 2013-12-15 DIAGNOSIS — I10 Essential (primary) hypertension: Secondary | ICD-10-CM | POA: Diagnosis not present

## 2013-12-15 DIAGNOSIS — IMO0001 Reserved for inherently not codable concepts without codable children: Secondary | ICD-10-CM | POA: Diagnosis not present

## 2013-12-15 DIAGNOSIS — Z471 Aftercare following joint replacement surgery: Secondary | ICD-10-CM | POA: Diagnosis not present

## 2013-12-15 DIAGNOSIS — M159 Polyosteoarthritis, unspecified: Secondary | ICD-10-CM | POA: Diagnosis not present

## 2013-12-15 DIAGNOSIS — F17200 Nicotine dependence, unspecified, uncomplicated: Secondary | ICD-10-CM | POA: Diagnosis not present

## 2013-12-17 ENCOUNTER — Other Ambulatory Visit (HOSPITAL_COMMUNITY): Payer: Medicare Other

## 2013-12-17 ENCOUNTER — Ambulatory Visit: Payer: Medicare Other | Admitting: Family

## 2013-12-17 ENCOUNTER — Encounter (HOSPITAL_COMMUNITY): Payer: Medicare Other

## 2013-12-17 DIAGNOSIS — M1710 Unilateral primary osteoarthritis, unspecified knee: Secondary | ICD-10-CM | POA: Diagnosis not present

## 2013-12-18 NOTE — Discharge Summary (Signed)
Physician Discharge Summary  Patient ID: Austin Diaz MRN: [id number] DOB/AGE: 11-Mar-1946 68 y.o.  Admit date: 12/04/2013 Discharge date: 12/06/2013  Admission Diagnoses:  Osteoarthritis of left knee  Discharge Diagnoses:  Principal Problem:   Osteoarthritis of left knee   Past Medical History  Diagnosis Date  . History of carotid endarterectomy     bilateral  . PVD (peripheral vascular disease)     noninvasive studies 10/2009 with ABI 0.35 on left and 0.70 on right  . SOB (shortness of breath)   . Hypertension     unspecified   . Nonfunctioning kidney     right kidney  . Tobacco abuse   . Hyperlipidemia   . History of non-ST elevation myocardial infarction (NSTEMI) SEPT 2010  . S/P CABG x 2   . S/P femoral-popliteal bypass surgery   . Acute medial meniscus tear of left knee   . Arthritis   . Coronary artery disease CARDIOLOGIST- DR ARIDA -- LAST  VISIT 06-01-11  IN [facility]---   PT DENIES S & S    s/p 2 v CABG 01/2009.  LIMA to LAD, SVG to OM  . PAD (peripheral artery disease)   . Claudication in peripheral vascular disease   . Acute medial meniscal injury of knee LEFT  . Anxiety   . Depression   . Dysrhythmia     episode of afib in past  . Diabetes mellitus     no meds necessary at present- as of 11/2013  . Neuromuscular disorder     neuropathy in legs & feet     Surgeries: Procedure(s): Left Total Knee Arthroplasty, Cemented, Computer Assist on 12/04/2013   Consultants (if any):  none  Discharged Condition: Improved  Hospital Course: Austin Diaz is an 68 y.o. male who was admitted 12/04/2013 with a diagnosis of Osteoarthritis of left knee and went to the operating room on 12/04/2013 and underwent the above named procedures.    He was given perioperative antibiotics:  Anti-infectives   Start     Dose/Rate Route Frequency Ordered Stop   12/04/13 0600  ceFAZolin (ANCEF) IVPB 2 g/50 mL premix     2 g 100 mL/hr over 30 Minutes Intravenous On call to O.R.  12/03/13 1259 12/04/13 1013    .  He was given sequential compression devices, early ambulation, and aspirin for DVT prophylaxis.  He benefited maximally from the hospital stay and there were no complications.    Recent vital signs:  Filed Vitals:   12/06/13 0610  BP: 101/59  Pulse: 77  Temp: 99.2 F (37.3 C)  Resp: 16    Recent laboratory studies:  Lab Results  Component Value Date   HGB 9.8* 12/06/2013   HGB 11.0* 12/05/2013   HGB 15.1 11/27/2013   Lab Results  Component Value Date   WBC 7.9 12/06/2013   PLT 120* 12/06/2013   Lab Results  Component Value Date   INR 1.06 11/27/2013   Lab Results  Component Value Date   NA 136* 12/05/2013   K 3.7 12/05/2013   CL 99 12/05/2013   CO2 27 12/05/2013   BUN 10 12/05/2013   CREATININE 1.21 12/05/2013   GLUCOSE 131* 12/05/2013    Discharge Medications:     Medication List         aspirin 325 MG tablet  Take 325 mg by mouth daily.     docusate sodium 100 MG capsule  Commonly known as:  COLACE  Take 300 mg by mouth at bedtime.  furosemide 40 MG tablet  Commonly known as:  LASIX  Take 40 mg by mouth daily.     gabapentin 600 MG tablet  Commonly known as:  NEURONTIN  Take 600 mg by mouth 2 (two) times daily.     HYDROcodone-acetaminophen 5-325 MG per tablet  Commonly known as:  NORCO/VICODIN  Take 1-2 tablets by mouth 2 (two) times daily. Take 2 in the morning and take 1 tablet at bedtime     methocarbamol 500 MG tablet  Commonly known as:  ROBAXIN  Take 1 tablet (500 mg total) by mouth every 6 (six) hours as needed for muscle spasms (spasm).     metoprolol 200 MG 24 hr tablet  Commonly known as:  TOPROL-XL  Take 100 mg by mouth at bedtime.     nitroGLYCERIN 0.4 MG SL tablet  Commonly known as:  NITROSTAT  Place 1 tablet (0.4 mg total) under the tongue every 5 (five) minutes as needed.     oxyCODONE-acetaminophen 5-325 MG per tablet  Commonly known as:  ROXICET  Take 1-2 tablets by mouth every 4 (four) hours as  needed.     terazosin 10 MG capsule  Commonly known as:  HYTRIN  Take 10 mg by mouth at bedtime.     Vitamin B-12 5000 MCG Subl  Place 5,000 mcg under the tongue daily.        Diagnostic Studies: Dg Chest 2 View  11/27/2013   CLINICAL DATA:  Preop evaluation.  EXAM: CHEST  2 VIEW  COMPARISON:  05/31/2010  FINDINGS: Two views of the chest demonstrate median sternotomy wires. Surgical clips in the lower neck. Lungs are clear without airspace disease or edema. Difficult to exclude trace bilateral pleural effusions. Heart size is normal.  IMPRESSION: Question trace pleural effusions. Otherwise, no acute chest findings.   Electronically Signed   By: Richarda Overlie M.D.   On: 11/27/2013 15:26    Disposition: 06-Home-Health Care Svc      Discharge Instructions   Full weight bearing    Complete by:  As directed          Keep knee incision dry for 5 days post op then may wet while bathing. Therapy daily and goal full extension and greater than 90 degrees flexion. Call if fever or chills or increased drainage. Go to ER if acutely short of breath or call for ambulance. Return for follow up in 2 weeks. May full weight bear on the surgical leg unless told otherwise. Use knee immobilizer until able to straight leg raise off bed with knee stable. In house walking for first 2 weeks. Ice packs daily for pain and swelling  Follow-up Information   Follow up with YATES,MARK C, MD. Schedule an appointment as soon as possible for a visit in 2 weeks.   Specialty:  Orthopedic Surgery   Contact information:   [address] [place] [address] [phone]       Follow up with [facility]. (Someone from [facility] will contact you concerning start date and timem )    Contact information:   [address] [place] [address] [phone]        Signed: Wende Neighbors 12/18/2013, 4:04 PM

## 2013-12-22 DIAGNOSIS — M6281 Muscle weakness (generalized): Secondary | ICD-10-CM | POA: Diagnosis not present

## 2013-12-22 DIAGNOSIS — M25569 Pain in unspecified knee: Secondary | ICD-10-CM | POA: Diagnosis not present

## 2013-12-22 DIAGNOSIS — M79609 Pain in unspecified limb: Secondary | ICD-10-CM | POA: Diagnosis not present

## 2013-12-28 DIAGNOSIS — M6281 Muscle weakness (generalized): Secondary | ICD-10-CM | POA: Diagnosis not present

## 2013-12-28 DIAGNOSIS — M25569 Pain in unspecified knee: Secondary | ICD-10-CM | POA: Diagnosis not present

## 2013-12-28 DIAGNOSIS — M79609 Pain in unspecified limb: Secondary | ICD-10-CM | POA: Diagnosis not present

## 2013-12-31 DIAGNOSIS — M6281 Muscle weakness (generalized): Secondary | ICD-10-CM | POA: Diagnosis not present

## 2013-12-31 DIAGNOSIS — M79609 Pain in unspecified limb: Secondary | ICD-10-CM | POA: Diagnosis not present

## 2013-12-31 DIAGNOSIS — M25569 Pain in unspecified knee: Secondary | ICD-10-CM | POA: Diagnosis not present

## 2014-02-09 DIAGNOSIS — E1140 Type 2 diabetes mellitus with diabetic neuropathy, unspecified: Secondary | ICD-10-CM | POA: Diagnosis not present

## 2014-02-09 DIAGNOSIS — B351 Tinea unguium: Secondary | ICD-10-CM | POA: Diagnosis not present

## 2014-02-09 DIAGNOSIS — L600 Ingrowing nail: Secondary | ICD-10-CM | POA: Diagnosis not present

## 2014-03-11 DIAGNOSIS — M170 Bilateral primary osteoarthritis of knee: Secondary | ICD-10-CM | POA: Diagnosis not present

## 2014-04-20 DIAGNOSIS — L600 Ingrowing nail: Secondary | ICD-10-CM | POA: Diagnosis not present

## 2014-04-20 DIAGNOSIS — B351 Tinea unguium: Secondary | ICD-10-CM | POA: Diagnosis not present

## 2014-04-20 DIAGNOSIS — E1140 Type 2 diabetes mellitus with diabetic neuropathy, unspecified: Secondary | ICD-10-CM | POA: Diagnosis not present

## 2014-07-26 ENCOUNTER — Encounter: Payer: Self-pay | Admitting: Family

## 2014-07-27 ENCOUNTER — Ambulatory Visit: Payer: Medicare Other | Admitting: Family

## 2014-07-27 ENCOUNTER — Other Ambulatory Visit (HOSPITAL_COMMUNITY): Payer: Medicare Other

## 2014-07-27 ENCOUNTER — Encounter (HOSPITAL_COMMUNITY): Payer: Medicare Other

## 2014-08-27 ENCOUNTER — Other Ambulatory Visit: Payer: Self-pay | Admitting: *Deleted

## 2014-08-27 ENCOUNTER — Encounter: Payer: Self-pay | Admitting: Family

## 2014-08-27 DIAGNOSIS — I739 Peripheral vascular disease, unspecified: Secondary | ICD-10-CM

## 2014-08-30 ENCOUNTER — Encounter (HOSPITAL_COMMUNITY): Payer: Medicare Other

## 2014-08-30 ENCOUNTER — Ambulatory Visit: Payer: Medicare Other | Admitting: Family

## 2014-08-30 ENCOUNTER — Other Ambulatory Visit (HOSPITAL_COMMUNITY): Payer: Medicare Other

## 2014-10-11 DIAGNOSIS — L600 Ingrowing nail: Secondary | ICD-10-CM | POA: Diagnosis not present

## 2014-10-11 DIAGNOSIS — E1140 Type 2 diabetes mellitus with diabetic neuropathy, unspecified: Secondary | ICD-10-CM | POA: Diagnosis not present

## 2014-10-11 DIAGNOSIS — B351 Tinea unguium: Secondary | ICD-10-CM | POA: Diagnosis not present

## 2014-12-27 ENCOUNTER — Encounter: Payer: Self-pay | Admitting: *Deleted

## 2014-12-27 ENCOUNTER — Ambulatory Visit (INDEPENDENT_AMBULATORY_CARE_PROVIDER_SITE_OTHER): Payer: Medicare Other | Admitting: Cardiology

## 2014-12-27 ENCOUNTER — Encounter: Payer: Self-pay | Admitting: Cardiology

## 2014-12-27 VITALS — HR 50 | Ht 69.0 in | Wt 201.8 lb

## 2014-12-27 DIAGNOSIS — R42 Dizziness and giddiness: Secondary | ICD-10-CM

## 2014-12-27 DIAGNOSIS — I2510 Atherosclerotic heart disease of native coronary artery without angina pectoris: Secondary | ICD-10-CM

## 2014-12-27 DIAGNOSIS — I10 Essential (primary) hypertension: Secondary | ICD-10-CM

## 2014-12-27 MED ORDER — METOPROLOL SUCCINATE ER 50 MG PO TB24: 50.0000 mg | ORAL_TABLET | Freq: Every day | ORAL | Status: DC

## 2014-12-27 NOTE — Patient Instructions (Signed)
Your physician recommends that you schedule a follow-up appointment in: 1 MONTH WITH DR. BRANCH  Your physician has recommended you make the following change in your medication:   DECREASE TOPROL XL 50 MG DAILY  Your physician has recommended that you wear an event monitor FOR 14 DAYS . Event monitors are medical devices that record the heart's electrical activity. Doctors most often [place] these monitors to diagnose arrhythmias. Arrhythmias are problems with the speed or rhythm of the heartbeat. The monitor is a small, portable device. You can wear one while you do your normal daily activities. This is usually used to diagnose what is causing palpitations/syncope (passing out).  Thank you for choosing [facility]!!

## 2014-12-27 NOTE — Progress Notes (Signed)
Patient ID: Austin Diaz, male   DOB: December 26, 1945, 69 y.o.   MRN: [id number]     Clinical Summary Mr. Dallman is a 69 y.o.male seen today for follow up of the following medical problems. Followed by [facility] cardiology as well.   1. CAD - prior 2 vessel CABG in 01/2009 (LIMA-LAD, SVG-OM) - LVEF 55-60% by echo 08/2010 - MPI Jan 2013 with mild ischemia in the mid to apical anteroseptal wall and basal inferolateral wall.   - no chest pain. No SOB or DOE. Fairly sedentary lifestyle because of chronic leg pain. - compliant with meds. Has not required lasix.   2. PAD - followed by vacsular   3. Diabetes - diet controlled, followed by PCP - reports he was on lisnopril prevoiusly, stopped for unclear reason  4. Atrial flutter - denies any palpitations - he is on metoprolol for rate control - CHADS2Vasc score is 4, he had been on eliquis but had issues with cost. He reports he is also followed by [facility] cardiology and is in the process of getting started on anticoag through the [place].     5. Dizziness - started 6- 8 months. Mainly occurs while sitting. Nonspecific feeling. Feels like a panic attack, mainly during driving.  - he reports he was evaluated at the [facility] for symptoms,  Recommendations were for cardiac monitoring but the patient did not want to drive [place] to get monitor.   Past Medical History  Diagnosis Date  . History of carotid endarterectomy     bilateral  . PVD (peripheral vascular disease)     noninvasive studies 10/2009 with ABI 0.35 on left and 0.70 on right  . SOB (shortness of breath)   . Hypertension     unspecified   . Nonfunctioning kidney     right kidney  . Tobacco abuse   . Hyperlipidemia   . History of non-ST elevation myocardial infarction (NSTEMI) SEPT 2010  . S/P CABG x 2   . S/P femoral-popliteal bypass surgery   . Acute medial meniscus tear of left knee   . Arthritis   . Coronary artery disease CARDIOLOGIST- DR ARIDA -- LAST  VISIT 06-01-11  IN [facility]---    PT DENIES S & S    s/p 2 v CABG 01/2009.  LIMA to LAD, SVG to OM  . PAD (peripheral artery disease)   . Claudication in peripheral vascular disease   . Acute medial meniscal injury of knee LEFT  . Anxiety   . Depression   . Dysrhythmia     episode of afib in past  . Diabetes mellitus     no meds necessary at present- as of 11/2013  . Neuromuscular disorder     neuropathy in legs & feet      Allergies  Allergen Reactions  . Morphine Other (See Comments)    HALLUCINATIONS     Current Outpatient Prescriptions  Medication Sig Dispense Refill  . aspirin 325 MG tablet Take 325 mg by mouth daily.     . Cyanocobalamin (VITAMIN B-12) 5000 MCG SUBL Place 5,000 mcg under the tongue daily.    Marland Kitchen docusate sodium (COLACE) 100 MG capsule Take 300 mg by mouth at bedtime.    . furosemide (LASIX) 40 MG tablet Take 40 mg by mouth daily.    Marland Kitchen gabapentin (NEURONTIN) 600 MG tablet Take 600 mg by mouth 2 (two) times daily.    Marland Kitchen HYDROcodone-acetaminophen (NORCO/VICODIN) 5-325 MG per tablet Take 1-2 tablets by mouth 2 (two) times  daily. Take 2 in the morning and take 1 tablet at bedtime    . methocarbamol (ROBAXIN) 500 MG tablet Take 1 tablet (500 mg total) by mouth every 6 (six) hours as needed for muscle spasms (spasm). 30 tablet 0  . metoprolol (TOPROL-XL) 200 MG 24 hr tablet Take 100 mg by mouth at bedtime.     . nitroGLYCERIN (NITROSTAT) 0.4 MG SL tablet Place 1 tablet (0.4 mg total) under the tongue every 5 (five) minutes as needed. 25 tablet 6  . oxyCODONE-acetaminophen (ROXICET) 5-325 MG per tablet Take 1-2 tablets by mouth every 4 (four) hours as needed. 60 tablet 0  . terazosin (HYTRIN) 10 MG capsule Take 10 mg by mouth at bedtime.     No current facility-administered medications for this visit.     Past Surgical History  Procedure Laterality Date  . Bilateral carotid endartectomy  RIGHT 1999/   LEFT 1995  . Left hip replacement      x3  LAST ONE 2003  . Right hip replacement      x2    . Right to left fem-fem bypass graft/ left fem-pop bypass graft/ right external iliac & common femoral endarterectomy  05-31-2010  . Cardiac catheterization  09- 24 & 27- 2010    HIGH-GRADE LEFT MAIN AND 2 VESSEL CAD,  S/P NSTEMI  . Renal artery angioplasty  05-26-2010    RIGHT COMMMON ILIAC ARTERY  . Lumbar disc surgery  YRS AGO  . Transthoracic echocardiogram  08-16-2010    NORMAL LVSF/ EF 55-60%/ MODERATELY DILATED LEFT ATRIUM  . Lexiscan myoview  05-28-2011  DR ARIDA ([facility] [place])    SMALL DISTAL ANTEROSEPTAL REVERSIBLE DEFECT WITH NORMAL EF 60%./  NO LARGE AREAS OF ISCHEMIA OR HIGH RISK FEATURES.  . Cataract extraction w/ intraocular lens implant      RIGHT  . Knee surgery  Feb. 2013    Left knee  . Coronary artery bypass graft  02-03-2009    X2- VESSEL CABG;  LIMA to LAD, SVG to OM  . Cataract extraction w/phaco  02/11/2012    Procedure: CATARACT EXTRACTION PHACO AND INTRAOCULAR LENS PLACEMENT (IOC);  Surgeon: Susa Simmonds, MD;  Location: [facility];  Service: Ophthalmology;  Laterality: Left;  CDE:11.27  . Cataract extraction      left  . Knee arthroplasty Left 12/04/2013    Procedure: Left Total Knee Arthroplasty, Cemented, Computer Assist;  Surgeon: Eldred Manges, MD;  Location: [facility] OR;  Service: Orthopedics;  Laterality: Left;  Left Total Knee Arthroplasty, Cemented, Computer Assist     Allergies  Allergen Reactions  . Morphine Other (See Comments)    HALLUCINATIONS      Family History  Problem Relation Age of Onset  . Osteoporosis Mother   . Stroke Mother      Social History Mr. Cogswell reports that he has been smoking Cigarettes.  He has a 25 pack-year smoking history. He has never used smokeless tobacco. Mr. Weidinger reports that he drinks about 2.4 oz of alcohol per week.   Review of Systems CONSTITUTIONAL: No weight loss, fever, chills, weakness or fatigue.  HEENT: Eyes: No visual loss, blurred vision, double vision or yellow sclerae.No hearing  loss, sneezing, congestion, runny nose or sore throat.  SKIN: No rash or itching.  CARDIOVASCULAR: per HPI RESPIRATORY: No shortness of breath, cough or sputum.  GASTROINTESTINAL: No anorexia, nausea, vomiting or diarrhea. No abdominal pain or blood.  GENITOURINARY: No burning on urination, no polyuria NEUROLOGICAL: No headache,  dizziness, syncope, paralysis, ataxia, numbness or tingling in the extremities. No change in bowel or bladder control.  MUSCULOSKELETAL: No muscle, back pain, joint pain or stiffness.  LYMPHATICS: No enlarged nodes. No history of splenectomy.  PSYCHIATRIC: No history of depression or anxiety.  ENDOCRINOLOGIC: No reports of sweating, cold or heat intolerance. No polyuria or polydipsia.  Marland Kitchen   Physical Examination Filed Vitals:   12/27/14 1300  Pulse: 50   Filed Vitals:   12/27/14 1300  Height:  (1.753 m)  Weight: 201 lb 12.8 oz (91.536 kg)    Gen: resting comfortably, no acute distress HEENT: no scleral icterus, pupils equal round and reactive, no palptable cervical adenopathy,  CV: RRR, no m/r/g, no JVD Resp: Clear to auscultation bilaterally GI: abdomen is soft, non-tender, non-distended, normal bowel sounds, no hepatosplenomegaly MSK: extremities are warm, no edema.  Skin: warm, no rash Neuro:  no focal deficits Psych: appropriate affect   Diagnostic Studies 10/2013 MPI IMPRESSION: 1. Normal Lexiscan Cardiolite stress test.  2. No evidence of myocardial ischemia or scar.  3. Normal left ventricular systolic function, calculated LVEF 69%.  4. Atrial flutter demonstrated on ECG tracings.    Assessment and Plan  1. CAD - continue current meds and risk factor modification - has not been on statin for unclear reasons, will discuss further with him at next visit  2. Atrial flutter - continue rate control with Toprol - patient is working through the [place] to start anticoagulation, eliquis from our clinic was too expensive and he is not  interested in coumadin. [facility].  - reports some episodes of dizziness. Orthostatics normal in clinic. Resting heart rate is mid 50s in clinic, will decrease his Toprol XL to  daily and obtain cardiac monitor - reports recent echo at [facility], will request results.   3. PAD - no current symptoms, continue risk factor modification   F/u 1 month   Antoine Poche, M.D.

## 2014-12-30 ENCOUNTER — Encounter (INDEPENDENT_AMBULATORY_CARE_PROVIDER_SITE_OTHER): Payer: Medicare Other

## 2014-12-30 DIAGNOSIS — R42 Dizziness and giddiness: Secondary | ICD-10-CM

## 2015-01-03 DIAGNOSIS — L600 Ingrowing nail: Secondary | ICD-10-CM | POA: Diagnosis not present

## 2015-01-03 DIAGNOSIS — E1140 Type 2 diabetes mellitus with diabetic neuropathy, unspecified: Secondary | ICD-10-CM | POA: Diagnosis not present

## 2015-01-03 DIAGNOSIS — B351 Tinea unguium: Secondary | ICD-10-CM | POA: Diagnosis not present

## 2015-01-26 ENCOUNTER — Telehealth: Payer: Self-pay | Admitting: *Deleted

## 2015-01-26 NOTE — Telephone Encounter (Signed)
Pt aware, confirmed 10/3 appt. Routed to pcp

## 2015-01-26 NOTE — Telephone Encounter (Signed)
-----   Message from Antoine Poche, MD sent at 01/25/2015  3:24 PM EDT ----- Heart monitor overall looks good, no significant abnormalities to explain his episodes of dizziness. Shows some afib with occasional low heart rates but this is mainly late night while he is sleeping and is fairly normal. Will discuss in further detail at our follow up  Dominga Ferry MD

## 2015-02-07 ENCOUNTER — Encounter: Payer: Self-pay | Admitting: Cardiology

## 2015-02-07 ENCOUNTER — Ambulatory Visit (INDEPENDENT_AMBULATORY_CARE_PROVIDER_SITE_OTHER): Payer: Medicare Other | Admitting: Cardiology

## 2015-02-07 VITALS — BP 149/62 | HR 50 | Ht 69.0 in | Wt 202.0 lb

## 2015-02-07 DIAGNOSIS — I4892 Unspecified atrial flutter: Secondary | ICD-10-CM | POA: Diagnosis not present

## 2015-02-07 DIAGNOSIS — I2510 Atherosclerotic heart disease of native coronary artery without angina pectoris: Secondary | ICD-10-CM | POA: Diagnosis not present

## 2015-02-07 MED ORDER — APIXABAN 5 MG PO TABS: 5.0000 mg | ORAL_TABLET | Freq: Two times a day (BID) | ORAL | Status: DC

## 2015-02-07 MED ORDER — ASPIRIN EC 81 MG PO TBEC: 81.0000 mg | DELAYED_RELEASE_TABLET | Freq: Every day | ORAL | Status: AC

## 2015-02-07 NOTE — Progress Notes (Signed)
Patient ID: ABDIRAHMAN CHITTUM, male   DOB: 05-15-1945, 69 y.o.   MRN: [id number]     Clinical Summary Mr. Meinzer is a 69 y.o.male seen today for follow up of the following medical problems.    1. CAD - prior 2 vessel CABG in 01/2009 (LIMA-LAD, SVG-OM) - LVEF 55-60% by echo 08/2010 - MPI Jan 2013 with mild ischemia in the mid to apical anteroseptal wall and basal inferolateral wall.   - no chest pain since last visit. No SOB or DOE. Fairly sedentary lifestyle because of chronic leg pain. - compliant with meds. Has not required lasix.   2. PAD - followed by vacsular   3. Diabetes - followed by pcp  4. Atrial flutter - denies any palpitations - he is on metoprolol for rate control - CHADS2Vasc score is 4, he had been on eliquis but had issues with cost. He reports he is also followed by [facility] cardiology and is in the process of getting started on anticoag through the [place].    5. Dizziness - last visit we decreased his Toprol XL to  daily.  - 12/2014 monitor showed afib, some low rates including pauses up to 2 seconds. No tachyarrhythmias. Episode of dizziness correspsonded with afib rate of 60.  - since last visit dizziness has resolved.   Past Medical History  Diagnosis Date  . History of carotid endarterectomy     bilateral  . PVD (peripheral vascular disease)     noninvasive studies 10/2009 with ABI 0.35 on left and 0.70 on right  . SOB (shortness of breath)   . Hypertension     unspecified   . Nonfunctioning kidney     right kidney  . Tobacco abuse   . Hyperlipidemia   . History of non-ST elevation myocardial infarction (NSTEMI) SEPT 2010  . S/P CABG x 2   . S/P femoral-popliteal bypass surgery   . Acute medial meniscus tear of left knee   . Arthritis   . Coronary artery disease CARDIOLOGIST- DR ARIDA -- LAST  VISIT 06-01-11  IN EPIC---   PT DENIES S & S    s/p 2 v CABG 01/2009.  LIMA to LAD, SVG to OM  . PAD (peripheral artery disease)   . Claudication in  peripheral vascular disease   . Acute medial meniscal injury of knee LEFT  . Anxiety   . Depression   . Dysrhythmia     episode of afib in past  . Diabetes mellitus     no meds necessary at present- as of 11/2013  . Neuromuscular disorder     neuropathy in legs & feet      Allergies  Allergen Reactions  . Morphine Other (See Comments)    HALLUCINATIONS     Current Outpatient Prescriptions  Medication Sig Dispense Refill  . aspirin 325 MG tablet Take 325 mg by mouth daily.     . Cyanocobalamin (VITAMIN B-12) 5000 MCG SUBL Place 5,000 mcg under the tongue daily.    Marland Kitchen docusate sodium (COLACE) 100 MG capsule Take 300 mg by mouth as needed.     . furosemide (LASIX) 40 MG tablet Take 40 mg by mouth daily as needed.     . gabapentin (NEURONTIN) 600 MG tablet Take 600 mg by mouth 2 (two) times daily.    Marland Kitchen HYDROcodone-acetaminophen (NORCO/VICODIN) 5-325 MG per tablet Take 1-2 tablets by mouth 2 (two) times daily. Take 2 in the morning and take 1 tablet at bedtime    .  lisinopril (PRINIVIL,ZESTRIL) 20 MG tablet Take 10 mg by mouth daily.    . metoprolol succinate (TOPROL-XL) 50 MG 24 hr tablet Take 1 tablet (50 mg total) by mouth daily. Take with or immediately following a meal. 90 tablet 3  . nitroGLYCERIN (NITROSTAT) 0.4 MG SL tablet Place 1 tablet (0.4 mg total) under the tongue every 5 (five) minutes as needed. 25 tablet 6  . terazosin (HYTRIN) 10 MG capsule Take 10 mg by mouth at bedtime.     No current facility-administered medications for this visit.     Past Surgical History  Procedure Laterality Date  . Bilateral carotid endartectomy  RIGHT 1999/   LEFT 1995  . Left hip replacement      x3  LAST ONE 2003  . Right hip replacement      x2  . Right to left fem-fem bypass graft/ left fem-pop bypass graft/ right external iliac & common femoral endarterectomy  05-31-2010  . Cardiac catheterization  09- 24 & 27- 2010    HIGH-GRADE LEFT MAIN AND 2 VESSEL CAD,  S/P NSTEMI  . Renal  artery angioplasty  05-26-2010    RIGHT COMMMON ILIAC ARTERY  . Lumbar disc surgery  YRS AGO  . Transthoracic echocardiogram  08-16-2010    NORMAL LVSF/ EF 55-60%/ MODERATELY DILATED LEFT ATRIUM  . Lexiscan myoview  05-28-2011  DR ARIDA ([facility] [place])    SMALL DISTAL ANTEROSEPTAL REVERSIBLE DEFECT WITH NORMAL EF 60%./  NO LARGE AREAS OF ISCHEMIA OR HIGH RISK FEATURES.  . Cataract extraction w/ intraocular lens implant      RIGHT  . Knee surgery  Feb. 2013    Left knee  . Coronary artery bypass graft  02-03-2009    X2- VESSEL CABG;  LIMA to LAD, SVG to OM  . Cataract extraction w/phaco  02/11/2012    Procedure: CATARACT EXTRACTION PHACO AND INTRAOCULAR LENS PLACEMENT (IOC);  Surgeon: Susa Simmonds, MD;  Location: [facility];  Service: Ophthalmology;  Laterality: Left;  CDE:11.27  . Cataract extraction      left  . Knee arthroplasty Left 12/04/2013    Procedure: Left Total Knee Arthroplasty, Cemented, Computer Assist;  Surgeon: Eldred Manges, MD;  Location: [facility];  Service: Orthopedics;  Laterality: Left;  Left Total Knee Arthroplasty, Cemented, Computer Assist     Allergies  Allergen Reactions  . Morphine Other (See Comments)    HALLUCINATIONS      Family History  Problem Relation Age of Onset  . Osteoporosis Mother   . Stroke Mother      Social History Mr. Leicht reports that he has been smoking Cigarettes.  He has a 50 pack-year smoking history. He has never used smokeless tobacco. Mr. Reister reports that he drinks about 2.4 oz of alcohol per week.   Review of Systems CONSTITUTIONAL: No weight loss, fever, chills, weakness or fatigue.  HEENT: Eyes: No visual loss, blurred vision, double vision or yellow sclerae.No hearing loss, sneezing, congestion, runny nose or sore throat.  SKIN: No rash or itching.  CARDIOVASCULAR: per HPI RESPIRATORY: No shortness of breath, cough or sputum.  GASTROINTESTINAL: No anorexia, nausea, vomiting or diarrhea. No abdominal pain or  blood.  GENITOURINARY: No burning on urination, no polyuria NEUROLOGICAL: No headache, dizziness, syncope, paralysis, ataxia, numbness or tingling in the extremities. No change in bowel or bladder control.  MUSCULOSKELETAL: No muscle, back pain, joint pain or stiffness.  LYMPHATICS: No enlarged nodes. No history of splenectomy.  PSYCHIATRIC: No history of depression  or anxiety.  ENDOCRINOLOGIC: No reports of sweating, cold or heat intolerance. No polyuria or polydipsia.  Marland Kitchen   Physical Examination Filed Vitals:   02/07/15 1303  BP: 149/62  Pulse: 50   Filed Vitals:   02/07/15 1303  Height:  (1.753 m)  Weight: 202 lb (91.627 kg)    Gen: resting comfortably, no acute distress HEENT: no scleral icterus, pupils equal round and reactive, no palptable cervical adenopathy,  CV: RRR, no m/r/g, noJVD Resp: Clear to auscultation bilaterally GI: abdomen is soft, non-tender, non-distended, normal bowel sounds, no hepatosplenomegaly MSK: extremities are warm, no edema.  Skin: warm, no rash Neuro:  no focal deficits Psych: appropriate affect   Diagnostic Studies 10/2013 MPI IMPRESSION: 1. Normal Lexiscan Cardiolite stress test.  2. No evidence of myocardial ischemia or scar.  3. Normal left ventricular systolic function, calculated LVEF 69%.  4. Atrial flutter demonstrated on ECG tracings.   12/2014 Event monitor  Atrial fibrillatin with variable rates including slow ventricular response with ocassional pauses up to 2 seconds. No tachyarrhythmias.  Episode of dizziness correlated with atrial fibrillation rate of 60   Assessment and Plan   1. CAD - no current symptoms - continue current meds and risk factor modification - he reports he is taking a statin at home, he is to call us back with the name and dosing.   2. Atrial flutter - continue rate control - we will start the patient on eliquis  bid. He is likely to convert his anticoagulation Rx to the [place] and  has a cardiology appointment with them coming up. Given samples and Rx card to help cover him while awaiting that appointment.   3. PAD - no current symptoms, continue risk factor modification - decrease ASA to  daily.    F/u 6 months  Antoine Poche, M.D.

## 2015-02-07 NOTE — Patient Instructions (Signed)
   Begin Eliquis  twice a day  - samples, free 30-day trial card, & printed scripts given today.  Decrease Aspirin to  daily  Continue all other medications.   Please call the office with name of cholesterol medication. 1 month follow up with anticoagulation nurse Misty Stanley) for new Eliquis management.  Your physician wants you to follow up in: 6 months.  You will receive a reminder letter in the mail one-two months in advance.  If you don't receive a letter, please call our office to schedule the follow up appointment

## 2015-03-14 DIAGNOSIS — L600 Ingrowing nail: Secondary | ICD-10-CM | POA: Diagnosis not present

## 2015-03-14 DIAGNOSIS — B351 Tinea unguium: Secondary | ICD-10-CM | POA: Diagnosis not present

## 2015-03-14 DIAGNOSIS — E1140 Type 2 diabetes mellitus with diabetic neuropathy, unspecified: Secondary | ICD-10-CM | POA: Diagnosis not present

## 2015-03-15 ENCOUNTER — Ambulatory Visit (INDEPENDENT_AMBULATORY_CARE_PROVIDER_SITE_OTHER): Payer: Medicare Other | Admitting: *Deleted

## 2015-03-15 DIAGNOSIS — I4892 Unspecified atrial flutter: Secondary | ICD-10-CM

## 2015-03-15 NOTE — Progress Notes (Signed)
Pt was started on Eliquis 5mg  bid for atrial flutter on 02/07/15 by Dr Wyline MoodBranch.  Labs from 12/06/13:  SrCr 1.2  Hgb 9.8  Hct 29.7  Denies any adverse effects since starting Eliquis.  Denies any bleeding, excessive bruising or GI upset.  Reviewed patients medication list.  Pt is not currently on any combined P-gp and strong CYP3A4 inhibitors/inducers (ketoconazole, traconazole, ritonavir, carbamazepine, phenytoin, rifampin, St. John's wort).  Reviewed labs from 02/25/15:  Weight 202.7, SrCr 1.32  CrCl 68.69.  Dose is appropriate based on 2 out of 3 criteria (wt,age,SrCr)   Hgb and HCT:  14.1/40.6  A full discussion of the nature of anticoagulants has been carried out.  A benefit/risk analysis has been presented to the patient, so that they understand the justification for choosing anticoagulation with Eliquis at this time.  The need for compliance is stressed.  Pt is aware to take the medication twice daily.  Side effects of potential bleeding are discussed, including unusual colored urine or stools, coughing up blood or coffee ground emesis, nose bleeds or serious fall or head trauma.  Discussed signs and symptoms of stroke. The patient should avoid any OTC items containing aspirin or ibuprofen.  Avoid alcohol consumption.   Call if any signs of abnormal bleeding.  Discussed financial obligations and resolved any difficulty in obtaining medication.  Next lab test test in 6 months.

## 2015-05-23 DIAGNOSIS — B351 Tinea unguium: Secondary | ICD-10-CM | POA: Diagnosis not present

## 2015-05-23 DIAGNOSIS — E1140 Type 2 diabetes mellitus with diabetic neuropathy, unspecified: Secondary | ICD-10-CM | POA: Diagnosis not present

## 2015-05-23 DIAGNOSIS — L600 Ingrowing nail: Secondary | ICD-10-CM | POA: Diagnosis not present

## 2015-08-02 DIAGNOSIS — E1140 Type 2 diabetes mellitus with diabetic neuropathy, unspecified: Secondary | ICD-10-CM | POA: Diagnosis not present

## 2015-08-02 DIAGNOSIS — B351 Tinea unguium: Secondary | ICD-10-CM | POA: Diagnosis not present

## 2015-08-02 DIAGNOSIS — L600 Ingrowing nail: Secondary | ICD-10-CM | POA: Diagnosis not present

## 2015-09-26 ENCOUNTER — Encounter: Payer: Self-pay | Admitting: Family

## 2015-09-29 ENCOUNTER — Other Ambulatory Visit: Payer: Self-pay | Admitting: Vascular Surgery

## 2015-09-29 ENCOUNTER — Ambulatory Visit (INDEPENDENT_AMBULATORY_CARE_PROVIDER_SITE_OTHER)
Admission: RE | Admit: 2015-09-29 | Discharge: 2015-09-29 | Disposition: A | Discharge status: Home / Self Care | Payer: Medicare Other | Source: Ambulatory Visit | Attending: Vascular Surgery | Admitting: Vascular Surgery

## 2015-09-29 ENCOUNTER — Ambulatory Visit (INDEPENDENT_AMBULATORY_CARE_PROVIDER_SITE_OTHER): Payer: Medicare Other | Admitting: Family

## 2015-09-29 ENCOUNTER — Ambulatory Visit (HOSPITAL_COMMUNITY)
Admission: RE | Admit: 2015-09-29 | Discharge: 2015-09-29 | Disposition: A | Discharge status: Home / Self Care | Payer: Medicare Other | Source: Ambulatory Visit | Attending: Family | Admitting: Family

## 2015-09-29 ENCOUNTER — Encounter: Payer: Self-pay | Admitting: Family

## 2015-09-29 VITALS — BP 168/88 | HR 69 | Temp 97.0°F | Resp 18 | Ht 69.0 in | Wt 211.0 lb

## 2015-09-29 DIAGNOSIS — F17200 Nicotine dependence, unspecified, uncomplicated: Secondary | ICD-10-CM

## 2015-09-29 DIAGNOSIS — I779 Disorder of arteries and arterioles, unspecified: Secondary | ICD-10-CM

## 2015-09-29 DIAGNOSIS — Z95828 Presence of other vascular implants and grafts: Secondary | ICD-10-CM

## 2015-09-29 DIAGNOSIS — E119 Type 2 diabetes mellitus without complications: Secondary | ICD-10-CM | POA: Insufficient documentation

## 2015-09-29 DIAGNOSIS — I2510 Atherosclerotic heart disease of native coronary artery without angina pectoris: Secondary | ICD-10-CM | POA: Insufficient documentation

## 2015-09-29 DIAGNOSIS — I10 Essential (primary) hypertension: Secondary | ICD-10-CM | POA: Insufficient documentation

## 2015-09-29 DIAGNOSIS — Z9889 Other specified postprocedural states: Secondary | ICD-10-CM

## 2015-09-29 DIAGNOSIS — I739 Peripheral vascular disease, unspecified: Secondary | ICD-10-CM | POA: Diagnosis not present

## 2015-09-29 DIAGNOSIS — Z4889 Encounter for other specified surgical aftercare: Secondary | ICD-10-CM

## 2015-09-29 DIAGNOSIS — I6523 Occlusion and stenosis of bilateral carotid arteries: Secondary | ICD-10-CM | POA: Diagnosis not present

## 2015-09-29 DIAGNOSIS — Z720 Tobacco use: Secondary | ICD-10-CM | POA: Insufficient documentation

## 2015-09-29 DIAGNOSIS — E785 Hyperlipidemia, unspecified: Secondary | ICD-10-CM | POA: Insufficient documentation

## 2015-09-29 DIAGNOSIS — Z48812 Encounter for surgical aftercare following surgery on the circulatory system: Secondary | ICD-10-CM

## 2015-09-29 NOTE — Progress Notes (Signed)
[facility] [place]   CC: Follow up peripheral artery occlusive disease  History of Present Illness Austin Diaz is a 70 y.o. male patient of Dr. [name] who is status post a right to left fem-fem bypass and a left femoral to above-the-knee popliteal bypass in January 2012. The patient also has a remote history of a right CEA in 1999 and a left CEA in 1995. [facility].  He returns today for follow up of his PAOD.  He reports bilateral calf claudication after walking about 100 feet, relieved with rest, denies non healing wounds. He denies any history of stroke or TIA.  He was last evaluated in our office on 05/20/12.  Pt states his blood pressure at the [facility] medical facility 3 weeks ago was about 136/62, is elevated now.  He had a left knee arthroplasty on 12/04/13.  Pt Diabetic: No, 5.7 A1C in 2015 (review of records) Pt smoker: smoker  (1/2 ppd, started at age 54 yrs)  Pt meds include: Statin :Yes, pravastatin Betablocker: Yes ASA: Yes Other anticoagulants/antiplatelets: Eliquis, for atrial fib per pt   Past Medical History  Diagnosis Date  . History of carotid endarterectomy     bilateral  . PVD (peripheral vascular disease) (HCC)     noninvasive studies 10/2009 with ABI 0.35 on left and 0.70 on right  . SOB (shortness of breath)   . Hypertension     unspecified   . Nonfunctioning kidney     right kidney  . Tobacco abuse   . Hyperlipidemia   . History of non-ST elevation myocardial infarction (NSTEMI) SEPT 2010  . S/P CABG x 2   . S/P femoral-popliteal bypass surgery   . Acute medial meniscus tear of left knee   . Arthritis   . Coronary artery disease CARDIOLOGIST- DR ARIDA -- LAST  VISIT 06-01-11  IN EPIC---   PT DENIES S & S    s/p 2 v CABG 01/2009.  LIMA to LAD, SVG to OM  . PAD (peripheral artery disease) (HCC)   . Claudication in peripheral vascular disease (HCC)   . Acute medial  meniscal injury of knee LEFT  . Anxiety   . Depression   . Dysrhythmia     episode of afib in past  . Diabetes mellitus     no meds necessary at present- as of 11/2013  . Neuromuscular disorder (HCC)     neuropathy in legs & feet     Social History Social History  Substance Use Topics  . Smoking status: Current Every Day Smoker -- 1.00 packs/day for 60 years    Types: Cigarettes    Start date: [birth year]/18/1954  . Smokeless tobacco: Never Used  . Alcohol Use: 2.4 oz/week    4 Shots of liquor per week     Comment: beer- not everyday, just once per week    Family History Family History  Problem Relation Age of Onset  . Osteoporosis Mother   . Stroke Mother     Past Surgical History  Procedure Laterality Date  . Bilateral carotid endartectomy  RIGHT 1999/   LEFT 1995  . Left hip replacement      x3  LAST ONE 2003  . Right hip replacement      x2  . Right to left fem-fem bypass graft/ left fem-pop bypass graft/ right external iliac & common femoral endarterectomy  05-31-2010  . Cardiac catheterization  09- 24 &  27- 2010    HIGH-GRADE LEFT MAIN AND 2 VESSEL CAD,  S/P NSTEMI  . Renal artery angioplasty  05-26-2010    RIGHT COMMMON ILIAC ARTERY  . Lumbar disc surgery  YRS AGO  . Transthoracic echocardiogram  08-16-2010    NORMAL LVSF/ EF 55-60%/ MODERATELY DILATED LEFT ATRIUM  . Lexiscan myoview  05-28-2011  DR ARIDA ([facility] [place])    SMALL DISTAL ANTEROSEPTAL REVERSIBLE DEFECT WITH NORMAL EF 60%./  NO LARGE AREAS OF ISCHEMIA OR HIGH RISK FEATURES.  . Cataract extraction w/ intraocular lens implant      RIGHT  . Knee surgery  Feb. 2013    Left knee  . Coronary artery bypass graft  02-03-2009    X2- VESSEL CABG;  LIMA to LAD, SVG to OM  . Cataract extraction w/phaco  02/11/2012    Procedure: CATARACT EXTRACTION PHACO AND INTRAOCULAR LENS PLACEMENT (IOC);  Surgeon: Susa Simmonds, MD;  Location: [facility];  Service: Ophthalmology;  Laterality: Left;  CDE:11.27  . Cataract  extraction      left  . Knee arthroplasty Left 12/04/2013    Procedure: Left Total Knee Arthroplasty, Cemented, Computer Assist;  Surgeon: Eldred Manges, MD;  Location: [facility];  Service: Orthopedics;  Laterality: Left;  Left Total Knee Arthroplasty, Cemented, Computer Assist    Allergies  Allergen Reactions  . Morphine Other (See Comments)    HALLUCINATIONS    Current Outpatient Prescriptions  Medication Sig Dispense Refill  . apixaban (ELIQUIS) 5 MG TABS tablet Take 1 tablet (5 mg total) by mouth 2 (two) times daily. 28 tablet 0  . aspirin EC 81 MG tablet Take 1 tablet (81 mg total) by mouth daily.    . Cyanocobalamin (VITAMIN B-12) 5000 MCG SUBL Place 5,000 mcg under the tongue daily.    Marland Kitchen HYDROcodone-acetaminophen (NORCO/VICODIN) 5-325 MG per tablet Take 2 tablets by mouth 3 (three) times daily.     Marland Kitchen lisinopril (PRINIVIL,ZESTRIL) 20 MG tablet Take 20 mg by mouth daily.     . metoprolol succinate (TOPROL-XL) 50 MG 24 hr tablet Take 1 tablet (50 mg total) by mouth daily. Take with or immediately following a meal. 90 tablet 3  . nitroGLYCERIN (NITROSTAT) 0.4 MG SL tablet Place 1 tablet (0.4 mg total) under the tongue every 5 (five) minutes as needed. 25 tablet 6  . terazosin (HYTRIN) 10 MG capsule Take 10 mg by mouth every morning.     . gabapentin (NEURONTIN) 600 MG tablet Take 600 mg by mouth 2 (two) times daily. Reported on 09/29/2015     No current facility-administered medications for this visit.    ROS: See HPI for pertinent positives and negatives.   Physical Examination  Filed Vitals:   09/29/15 1433 09/29/15 1444  BP: 174/79 168/88  Pulse: 69 69  Temp: 97 F (36.1 C)   Resp: 18   Height:  (1.753 m)   Weight: 211 lb (95.709 kg)   SpO2: 91%    Body mass index is 31.15 kg/(m^2).  General: A&O x 3, WDWN, obese male. Gait: normal Eyes: PERRLA. Pulmonary: CTAB, without wheezes , rales or rhonchi. Cardiac: Irregular rhythm, no detected murmur.         Carotid  Bruits Right Left   Positive Negative  Aorta is not palpable. Radial pulses: are 2+ palpable and =             Femoral to femoral bypass pulse is strongly palpable  VASCULAR EXAM: Extremities without ischemic changes, without Gangrene; without open wounds.                                                                                                          LE Pulses Right Left       FEMORAL   palpable   palpable        POPLITEAL  not palpable   not palpable       POSTERIOR TIBIAL  not palpable   not palpable        DORSALIS PEDIS      ANTERIOR TIBIAL not palpable  2+ palpable    Abdomen: soft, large panus, NT, moderate sized asymptomatic ventral hernia. Skin: no rashes, no ulcers noted. Musculoskeletal: no muscle wasting or atrophy.  Neurologic: A&O X 3; Appropriate Affect ; SENSATION: normal; MOTOR FUNCTION:  moving all extremities equally, motor strength 5/5 throughout. Speech is fluent/normal.  CN 2-12 intact.    Non-Invasive Vascular Imaging: DATE: 09/29/2015   LOWER EXTREMITY ARTERIAL DUPLEX EVALUATION    INDICATION: Follow up left lower extremity    PREVIOUS INTERVENTION(S): Right to left femoral to femoral bypass graft, left femoral to above knee popliteal bypass graft, right external iliac artery/common femoral artery endarterectomy performed 05/31/2010    DUPLEX EXAM:     RIGHT  LEFT   Peak Systolic Velocity (cm/s) Ratio (if abnormal) Waveform  Peak Systolic Velocity (cm/s) Ratio (if abnormal) Waveform     Inflow Artery 74  B     Proximal Anastomosis 93  B     Proximal Graft 69  B     Mid Graft 110  B      Distal Graft 107  B     Distal Anastomosis 83  B     Outflow Artery 134  B  0.50 Today's ABI / TBI 1.0  0.67 Previous ABI / TBI ( 10/30/12 ) 0.95    Waveform:    M - Monophasic       B - Biphasic       T - Triphasic  If Ankle Brachial Index (ABI) or Toe Brachial Index (TBI) performed, please see complete report     ADDITIONAL  FINDINGS:     IMPRESSION: 1. Patent right to left femoral to femoral bypass graft with no evidence for restenosis. 2. Patent left femoral to popliteal bypass graft with no evidence for stenosis. 3. The right superficial femoral artery appears occluded proximally to distally with collaterals visualized providing flow on distally    Compared to the previous exam:  No recent exam    ABI: RIGHT: 0.50 (0.67, 10/30/12), Waveforms: monophasic;  LEFT: 1.0 (0.95), Waveforms: triphasic    ASSESSMENT: Austin Diaz is a 70 y.o. male who is status post a right to left fem-fem bypass and a left femoral to above-the-knee popliteal bypass in January 2012. The patient also has a remote history of a right CEA in 1999 and a left CEA in 1995.  [facility] pt. 2014 carotid duplex  suggested Right ICA with 1- 39 % stenosis and occluded left ICA.  Today's left LE arterial duplex suggests a patent right to left femoral to femoral bypass graft with no evidence for restenosis. Patent left femoral to popliteal bypass graft with no evidence for stenosis. The right superficial femoral artery appears occluded proximally to distally with collaterals visualized providing flow on distally. Right ABI indicates moderate arterial occlusive disease with monophasic waveforms. Left ABI remains normal with triphasic waveforms.   Pt has equal claudication like sx's in both calves after walking about 100 feet, both relieved with rest. This degree of claudication is plausible for his right leg with moderate arterial occlusive disease, but not for his left leg with normal ABI and triphasic waveforms. There is another etiology for his left calf pain with walking that is not due to lack of arterial perfusion. He denies any known lumbar spine issues, but noted in his past surgical history is remote history of lumbar disc surgery.   His atherosclerotic risk factors include active  smoking for over 55 years, CAD, and obesity. He takes a statin and ASA, take Eliquis for atrial fib.   PLAN:  The patient was counseled re smoking cessation and given several free resources re smoking cessation. Graduated walking program discussed and how to execute.   Based on the patient's vascular studies and examination, pt will return to clinic in 1 year with ABI's and left LE arterial duplex.   I discussed in depth with the patient the nature of atherosclerosis, and emphasized the importance of maximal medical management including strict control of blood pressure, blood glucose, and lipid levels, obtaining regular exercise, and cessation of smoking.  The patient is aware that without maximal medical management the underlying atherosclerotic disease process will progress, limiting the benefit of any interventions.  The patient was given information about PAD including signs, symptoms, treatment, what symptoms should prompt the patient to seek immediate medical care, and risk reduction measures to take.  Charisse MarchSuzanne Ailana Cuadrado, RN, MSN, FNP-C [facility] [place] Office Phone: [phone]  Clinic MD: Darrick PennaFields  09/29/2015 3:01 PM

## 2015-09-29 NOTE — Progress Notes (Signed)
Filed Vitals:   09/29/15 1433 09/29/15 1444  BP: 174/79 168/88  Pulse: 69 69  Temp: 97 F (36.1 C)   Resp: 18   Height: 5\' 9"  (1.753 m)   Weight: 211 lb (95.709 kg)   SpO2: 91%

## 2015-10-18 DIAGNOSIS — L600 Ingrowing nail: Secondary | ICD-10-CM | POA: Diagnosis not present

## 2015-10-18 DIAGNOSIS — E1140 Type 2 diabetes mellitus with diabetic neuropathy, unspecified: Secondary | ICD-10-CM | POA: Diagnosis not present

## 2015-10-18 DIAGNOSIS — B351 Tinea unguium: Secondary | ICD-10-CM | POA: Diagnosis not present

## 2015-12-02 NOTE — Addendum Note (Signed)
Addended by: Dannielle Karvonen on: 12/02/2015 01:47 PM   Modules accepted: Orders

## 2016-01-03 DIAGNOSIS — E1140 Type 2 diabetes mellitus with diabetic neuropathy, unspecified: Secondary | ICD-10-CM | POA: Diagnosis not present

## 2016-01-03 DIAGNOSIS — B351 Tinea unguium: Secondary | ICD-10-CM | POA: Diagnosis not present

## 2016-01-03 DIAGNOSIS — L600 Ingrowing nail: Secondary | ICD-10-CM | POA: Diagnosis not present

## 2016-02-02 ENCOUNTER — Telehealth: Payer: Self-pay | Admitting: Cardiology

## 2016-02-02 NOTE — Telephone Encounter (Signed)
Noted - will forward to provider as Lorain ChildesFYI

## 2016-02-02 NOTE — Telephone Encounter (Signed)
Mr. Austin Diaz called stating that [facility] and his Eliquis checks.

## 2016-03-13 DIAGNOSIS — E1140 Type 2 diabetes mellitus with diabetic neuropathy, unspecified: Secondary | ICD-10-CM | POA: Diagnosis not present

## 2016-03-13 DIAGNOSIS — L600 Ingrowing nail: Secondary | ICD-10-CM | POA: Diagnosis not present

## 2016-03-13 DIAGNOSIS — B351 Tinea unguium: Secondary | ICD-10-CM | POA: Diagnosis not present

## 2016-03-26 IMAGING — CR DG CHEST 2V
2 series · 2 of 2 positions shown · non-contrast
Comparison: 05/31/2010

CLINICAL DATA: Preop evaluation.

EXAM:
CHEST  2 VIEW

[w chest pa]
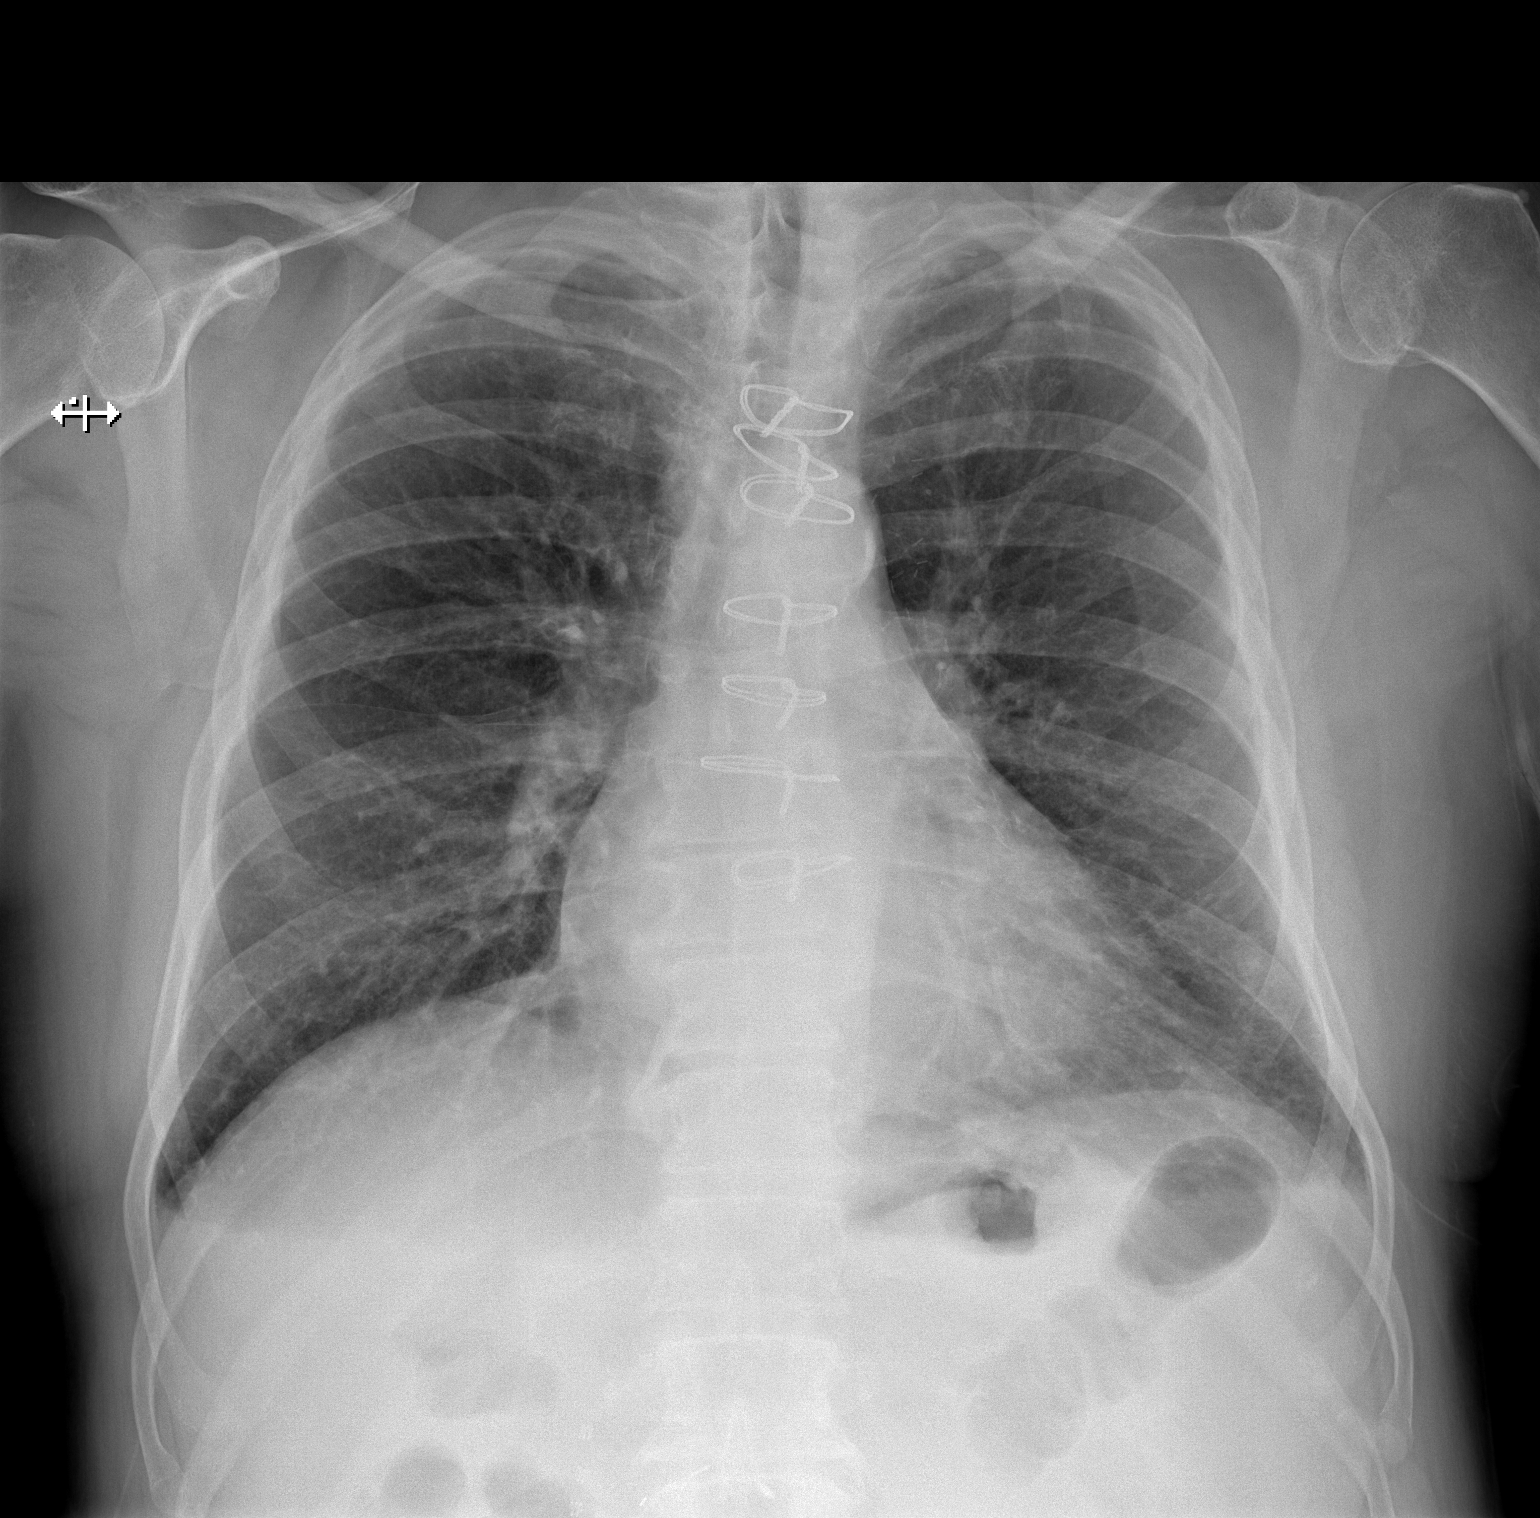

[w chest lat]
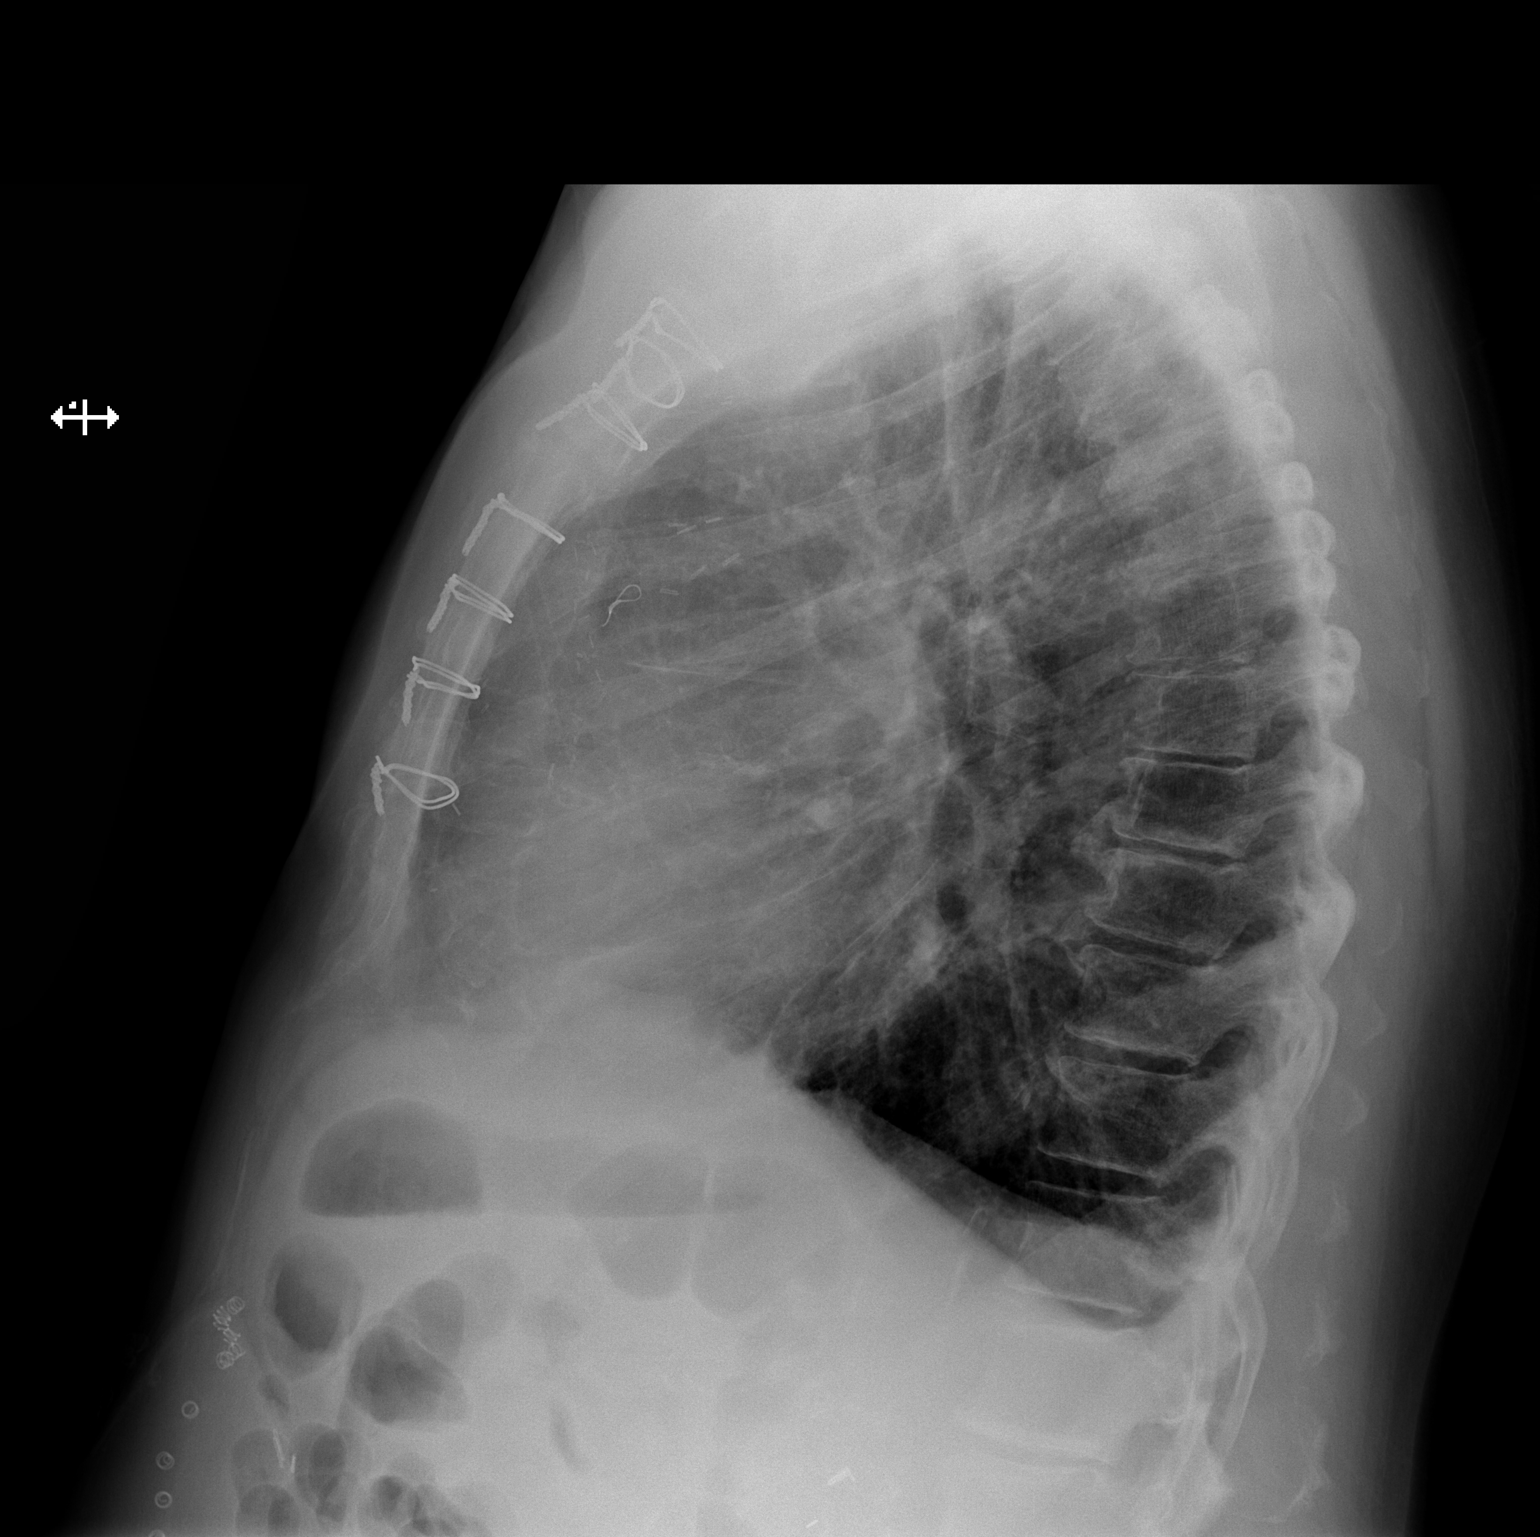

[2 of 2 positions shown; findings below may reference images not displayed]

FINDINGS: Two views of the chest demonstrate median sternotomy wires. Surgical
clips in the lower neck. Lungs are clear without airspace disease or
edema. Difficult to exclude trace bilateral pleural effusions. Heart
size is normal.
IMPRESSION: Question trace pleural effusions. Otherwise, no acute chest
findings.

## 2016-05-22 DIAGNOSIS — L600 Ingrowing nail: Secondary | ICD-10-CM | POA: Diagnosis not present

## 2016-05-22 DIAGNOSIS — B351 Tinea unguium: Secondary | ICD-10-CM | POA: Diagnosis not present

## 2016-05-22 DIAGNOSIS — E1140 Type 2 diabetes mellitus with diabetic neuropathy, unspecified: Secondary | ICD-10-CM | POA: Diagnosis not present

## 2016-05-29 ENCOUNTER — Ambulatory Visit: Payer: Self-pay | Admitting: Family Medicine

## 2016-06-07 ENCOUNTER — Ambulatory Visit: Payer: Self-pay | Admitting: Family Medicine

## 2016-06-11 ENCOUNTER — Encounter: Payer: Self-pay | Admitting: Family Medicine

## 2016-06-25 ENCOUNTER — Ambulatory Visit (INDEPENDENT_AMBULATORY_CARE_PROVIDER_SITE_OTHER): Payer: Medicare Other | Admitting: Family Medicine

## 2016-06-25 ENCOUNTER — Encounter: Payer: Self-pay | Admitting: Family Medicine

## 2016-06-25 ENCOUNTER — Encounter (INDEPENDENT_AMBULATORY_CARE_PROVIDER_SITE_OTHER): Payer: Self-pay

## 2016-06-25 VITALS — BP 130/78 | HR 56 | Temp 97.3°F | Ht 69.0 in | Wt 196.6 lb

## 2016-06-25 DIAGNOSIS — F339 Major depressive disorder, recurrent, unspecified: Secondary | ICD-10-CM

## 2016-06-25 DIAGNOSIS — I10 Essential (primary) hypertension: Secondary | ICD-10-CM | POA: Diagnosis not present

## 2016-06-25 DIAGNOSIS — I739 Peripheral vascular disease, unspecified: Secondary | ICD-10-CM

## 2016-06-25 DIAGNOSIS — I2510 Atherosclerotic heart disease of native coronary artery without angina pectoris: Secondary | ICD-10-CM

## 2016-06-25 MED ORDER — BUPROPION HCL ER (SR) 150 MG PO TB12: 150.0000 mg | ORAL_TABLET | Freq: Two times a day (BID) | ORAL | 2 refills | Status: DC

## 2016-06-25 NOTE — Patient Instructions (Signed)
Great to meet you!  Start bupropion 150 mg once daily for 3 days, then start 1 pill twice daily  Come back in 3-4 weeks to follow up for depression.   Have your labs [facility].

## 2016-06-25 NOTE — Progress Notes (Signed)
   HPI  Patient presents today here to establish care, he has depression.  Patient is currently being seen [facility] at [facility], he would like a civilian provider for sick visits.  He complains of depression and anhedonia. This is been going on for many months. He is previously started with the appropriate on without side effect. He has passive suicidal thoughts on the pH Q9- he contracts for safety.  Patient has no problems with his current medications.  PMH: Anxiety, arthritis, CHF, claudication with PAD, CAD status post CABG, depression, diet-controlled diabetes, hyperlipidemia Surgical history significant for left knee replacement, CABG 2, lumbar disc surgery, right to left femorofemoral bypass Family history positive for osteoporosis and stroke in mother Social history: Former smoker, moderate alcohol use, no drug use ROS: Per HPI  Objective: BP 130/78   Pulse (!) 56   Temp 97.3 F (36.3 C) (Oral)   Ht 5\' 9"  (1.753 m)   Wt 196 lb 9.6 oz (89.2 kg)   BMI 29.03 kg/m  Gen: NAD, alert, cooperative with exam HEENT: NCAT, oropharynx moist and clear, arcus senilis, PERRLA with artificial lenses in place can use  CV: RRR, good S1/S2, no murmur Resp: CTABL, no wheezes, non-labored Abd: SNTND, BS present, no guarding or organomegaly - Large ventral hernia  Ext: No edema, warm Neuro: Alert and oriented, No gross deficits  Assessment and plan:  # Depression Start Wellbutrin today, follow-up 3-4 weeks. Discussed 1503 days, then 150 twice daily. Patient with some passive suicidal thoughts, however he contracts for safety and states that he would not hurt himself.  # HTN Well-controlled on metoprolol, Prinzide, and Terazosin  # CAD status post CABG 2, PAD status post femorofemoral bypass Continue beta blocker, statin Labs per [facility], request. No chest pain, he does have calf pain bilaterally  Chronicpain meds per pain management in [place]  Anticoagulated on eliquis for A fib-  rate controlled  Meds ordered this encounter  Medications  . HYDROcodone-acetaminophen (NORCO) 7.5-325 MG tablet    Refill:  0  . pravastatin (PRAVACHOL) 10 MG tablet    Sig: Take 10 mg by mouth daily.  Marland Kitchen. apixaban (ELIQUIS) 5 MG TABS tablet    Sig: Take 5 mg by mouth 2 (two) times daily.  Marland Kitchen. buPROPion (WELLBUTRIN SR) 150 MG 12 hr tablet    Sig: Take 1 tablet (150 mg total) by mouth 2 (two) times daily.    Dispense:  60 tablet    Refill:  2    Murtis SinkSam Eliah Marquard, MD [facility] 06/25/2016, 1:41 PM

## 2016-07-23 ENCOUNTER — Encounter: Payer: Self-pay | Admitting: Family Medicine

## 2016-07-23 ENCOUNTER — Ambulatory Visit (INDEPENDENT_AMBULATORY_CARE_PROVIDER_SITE_OTHER): Payer: Medicare Other | Admitting: Family Medicine

## 2016-07-23 VITALS — BP 96/64 | HR 50 | Temp 97.0°F | Ht 69.0 in | Wt 194.2 lb

## 2016-07-23 DIAGNOSIS — F339 Major depressive disorder, recurrent, unspecified: Secondary | ICD-10-CM

## 2016-07-23 DIAGNOSIS — I2510 Atherosclerotic heart disease of native coronary artery without angina pectoris: Secondary | ICD-10-CM

## 2016-07-23 MED ORDER — MIRTAZAPINE 15 MG PO TABS: 15.0000 mg | ORAL_TABLET | Freq: Every day | ORAL | 3 refills | Status: DC

## 2016-07-23 NOTE — Patient Instructions (Signed)
Great to see you!  Try mirtazepine 1 pill once a night for your mood.   Lets follow up in 1 month to see if you are feeling better yet.

## 2016-07-23 NOTE — Progress Notes (Signed)
   HPI  Patient presents today here for depression follow-up.  Patient explains that he has severe anhedonia, he has low energy, and he has difficulty sleeping.  Moderate depression screening he marked that he has feelings of being better off dead on some days, however whenever I discussed this with him he denies any thoughts of feeling better off dead or  suicidal thoughts.  Patient states that he sleeps okay. He has tried Wellbutrin previously and did well with this, however this time he tried it and had dizziness. He did not continue the medication.  [facility]  PMH: Smoking status noted ROS: Per HPI  Objective: BP 96/64   Pulse (!) 50   Temp 97 F (36.1 C) (Oral)   Ht 5\' 9"  (1.753 m)   Wt 194 lb 3.2 oz (88.1 kg)   BMI 28.68 kg/m  Gen: NAD, alert, cooperative with exam HEENT: NCAT CV: RRR, good S1/S2, no murmur Resp: CTABL, no wheezes, non-labored Ext: No edema, warm Neuro: Alert and oriented, No gross deficits  [facility] 07/23/2016 06/25/2016  Decreased Interest 3 3  Down, Depressed, Hopeless 3 3  PHQ - 2 Score 6 6  Altered sleeping 3 3  Tired, decreased energy 3 3  Change in appetite 0 0  Feeling bad or failure about yourself  0 0  Trouble concentrating 0 0  Moving slowly or fidgety/restless 0 0  Suicidal thoughts 1 1  PHQ-9 Score 13 13  Difficult doing work/chores Not difficult at all -   Denise SI and feelings of being better of dead when discussed  Assessment and plan:  # Depression Severe anhedonia, did not tolerate Wellbutrin Trial of remeron, doesn't sleep well.  RTC in 1 month to discuss.  Denies SI and passive thoughts of being better off dead today.     Meds ordered this encounter  Medications  . mirtazapine (REMERON) 15 MG tablet    Sig: Take 1 tablet (15 mg total) by mouth at bedtime.    Dispense:  30 tablet    Refill:  3    Murtis SinkSam Beckie Viscardi, MD [facility] 07/23/2016, 2:08 PM

## 2016-08-07 DIAGNOSIS — L600 Ingrowing nail: Secondary | ICD-10-CM | POA: Diagnosis not present

## 2016-08-07 DIAGNOSIS — E1140 Type 2 diabetes mellitus with diabetic neuropathy, unspecified: Secondary | ICD-10-CM | POA: Diagnosis not present

## 2016-08-07 DIAGNOSIS — B351 Tinea unguium: Secondary | ICD-10-CM | POA: Diagnosis not present

## 2016-08-08 DIAGNOSIS — M5416 Radiculopathy, lumbar region: Secondary | ICD-10-CM | POA: Diagnosis not present

## 2016-08-08 DIAGNOSIS — G894 Chronic pain syndrome: Secondary | ICD-10-CM | POA: Diagnosis not present

## 2016-08-28 ENCOUNTER — Ambulatory Visit (INDEPENDENT_AMBULATORY_CARE_PROVIDER_SITE_OTHER): Payer: Medicare Other | Admitting: Family Medicine

## 2016-08-28 ENCOUNTER — Encounter: Payer: Self-pay | Admitting: Family Medicine

## 2016-08-28 VITALS — BP 92/51 | HR 92 | Temp 97.3°F | Ht 69.0 in | Wt 194.8 lb

## 2016-08-28 DIAGNOSIS — F339 Major depressive disorder, recurrent, unspecified: Secondary | ICD-10-CM | POA: Diagnosis not present

## 2016-08-28 DIAGNOSIS — G8929 Other chronic pain: Secondary | ICD-10-CM | POA: Diagnosis not present

## 2016-08-28 DIAGNOSIS — M791 Myalgia: Secondary | ICD-10-CM

## 2016-08-28 DIAGNOSIS — I2510 Atherosclerotic heart disease of native coronary artery without angina pectoris: Secondary | ICD-10-CM | POA: Diagnosis not present

## 2016-08-28 DIAGNOSIS — M7918 Myalgia, other site: Principal | ICD-10-CM

## 2016-08-28 MED ORDER — DULOXETINE HCL 30 MG PO CPEP: 30.0000 mg | ORAL_CAPSULE | Freq: Every day | ORAL | 3 refills | Status: AC

## 2016-08-28 MED ORDER — CITALOPRAM HYDROBROMIDE 20 MG PO TABS: 20.0000 mg | ORAL_TABLET | Freq: Every day | ORAL | 2 refills | Status: DC

## 2016-08-28 NOTE — Patient Instructions (Signed)
Great to see you again!  Come back in 1 month to see how you are doing.    Start citalopram 1 pill once daily for depression.

## 2016-08-28 NOTE — Progress Notes (Signed)
   HPI  Patient presents today for follow-up depression.  Patient requests naproxen plus PPI combination pill for pain. Patient states that he's on pain medication from the [place], however his friend has good benefit from this medication.  Depression Patient has tried Wellbutrin which he did not tolerate, he tried Remeron for 30 days He states that this "made him feel bad lobe. He denies suicidal thoughts. Still has severe anhedonia, fatigue and tiredness.  PMH: Smoking status noted ROS: Per HPI  Objective: BP (!) 92/51   Pulse 92   Temp 97.3 F (36.3 C) (Oral)   Ht  (1.753 m)   Wt 194 lb 12.8 oz (88.4 kg)   BMI 28.77 kg/m  Gen: NAD, alert, cooperative with exam HEENT: NCAT CV: RRR, good S1/S2, no murmur Resp: CTABL, no wheezes, non-labored Ext: No edema, warm Neuro: Alert and oriented, No gross deficits  Assessment and plan:  # Depression Severe, discontinue Remeron, trial of Cymbalta Follow-up 3-4 weeks.  # Chronic musculoskeletal pain Cymbalta may also help with this, recommended against NSAIDs for him. [facility]    Meds ordered this encounter  Medications  . DISCONTD: citalopram (CELEXA) 20 MG tablet    Sig: Take 1 tablet (20 mg total) by mouth daily.    Dispense:  30 tablet    Refill:  2  . DULoxetine (CYMBALTA) 30 MG capsule    Sig: Take 1 capsule (30 mg total) by mouth daily.    Dispense:  30 capsule    Refill:  3    Please dc citalopram, change of plan after further discussion    Murtis Sink, MD [facility] 08/28/2016, 2:51 PM

## 2016-09-11 DIAGNOSIS — M5136 Other intervertebral disc degeneration, lumbar region: Secondary | ICD-10-CM | POA: Diagnosis not present

## 2016-09-11 DIAGNOSIS — M47816 Spondylosis without myelopathy or radiculopathy, lumbar region: Secondary | ICD-10-CM | POA: Diagnosis not present

## 2016-09-11 DIAGNOSIS — M9903 Segmental and somatic dysfunction of lumbar region: Secondary | ICD-10-CM | POA: Diagnosis not present

## 2016-09-13 DIAGNOSIS — M47816 Spondylosis without myelopathy or radiculopathy, lumbar region: Secondary | ICD-10-CM | POA: Diagnosis not present

## 2016-09-13 DIAGNOSIS — M9903 Segmental and somatic dysfunction of lumbar region: Secondary | ICD-10-CM | POA: Diagnosis not present

## 2016-09-13 DIAGNOSIS — M5136 Other intervertebral disc degeneration, lumbar region: Secondary | ICD-10-CM | POA: Diagnosis not present

## 2016-09-21 ENCOUNTER — Ambulatory Visit: Payer: Medicare Other | Admitting: Family Medicine

## 2016-09-26 ENCOUNTER — Encounter: Payer: Self-pay | Admitting: Family

## 2016-09-26 DIAGNOSIS — M9903 Segmental and somatic dysfunction of lumbar region: Secondary | ICD-10-CM | POA: Diagnosis not present

## 2016-09-26 DIAGNOSIS — M5136 Other intervertebral disc degeneration, lumbar region: Secondary | ICD-10-CM | POA: Diagnosis not present

## 2016-09-26 DIAGNOSIS — M47816 Spondylosis without myelopathy or radiculopathy, lumbar region: Secondary | ICD-10-CM | POA: Diagnosis not present

## 2016-10-03 DIAGNOSIS — M47816 Spondylosis without myelopathy or radiculopathy, lumbar region: Secondary | ICD-10-CM | POA: Diagnosis not present

## 2016-10-03 DIAGNOSIS — M5136 Other intervertebral disc degeneration, lumbar region: Secondary | ICD-10-CM | POA: Diagnosis not present

## 2016-10-03 DIAGNOSIS — M5416 Radiculopathy, lumbar region: Secondary | ICD-10-CM | POA: Diagnosis not present

## 2016-10-03 DIAGNOSIS — M9903 Segmental and somatic dysfunction of lumbar region: Secondary | ICD-10-CM | POA: Diagnosis not present

## 2016-10-03 DIAGNOSIS — G894 Chronic pain syndrome: Secondary | ICD-10-CM | POA: Diagnosis not present

## 2016-10-09 ENCOUNTER — Ambulatory Visit (HOSPITAL_COMMUNITY): Payer: Medicare Other

## 2016-10-09 ENCOUNTER — Ambulatory Visit: Payer: Medicare Other | Admitting: Family

## 2016-10-23 DIAGNOSIS — L600 Ingrowing nail: Secondary | ICD-10-CM | POA: Diagnosis not present

## 2016-10-23 DIAGNOSIS — B351 Tinea unguium: Secondary | ICD-10-CM | POA: Diagnosis not present

## 2016-10-23 DIAGNOSIS — E1140 Type 2 diabetes mellitus with diabetic neuropathy, unspecified: Secondary | ICD-10-CM | POA: Diagnosis not present

## 2016-12-03 DIAGNOSIS — M5416 Radiculopathy, lumbar region: Secondary | ICD-10-CM | POA: Diagnosis not present

## 2016-12-03 DIAGNOSIS — Z79899 Other long term (current) drug therapy: Secondary | ICD-10-CM | POA: Diagnosis not present

## 2016-12-03 DIAGNOSIS — G894 Chronic pain syndrome: Secondary | ICD-10-CM | POA: Diagnosis not present

## 2016-12-05 ENCOUNTER — Ambulatory Visit: Payer: Medicare Other | Admitting: Physician Assistant

## 2017-01-08 DIAGNOSIS — E1140 Type 2 diabetes mellitus with diabetic neuropathy, unspecified: Secondary | ICD-10-CM | POA: Diagnosis not present

## 2017-01-08 DIAGNOSIS — B351 Tinea unguium: Secondary | ICD-10-CM | POA: Diagnosis not present

## 2017-01-08 DIAGNOSIS — L600 Ingrowing nail: Secondary | ICD-10-CM | POA: Diagnosis not present

## 2017-01-29 DIAGNOSIS — G894 Chronic pain syndrome: Secondary | ICD-10-CM | POA: Diagnosis not present

## 2017-01-29 DIAGNOSIS — M5416 Radiculopathy, lumbar region: Secondary | ICD-10-CM | POA: Diagnosis not present

## 2017-02-05 DIAGNOSIS — I10 Essential (primary) hypertension: Secondary | ICD-10-CM | POA: Diagnosis not present

## 2017-02-05 DIAGNOSIS — N400 Enlarged prostate without lower urinary tract symptoms: Secondary | ICD-10-CM | POA: Diagnosis present

## 2017-02-05 DIAGNOSIS — E119 Type 2 diabetes mellitus without complications: Secondary | ICD-10-CM | POA: Diagnosis not present

## 2017-02-05 DIAGNOSIS — I4891 Unspecified atrial fibrillation: Secondary | ICD-10-CM | POA: Diagnosis not present

## 2017-02-05 DIAGNOSIS — J9621 Acute and chronic respiratory failure with hypoxia: Secondary | ICD-10-CM | POA: Diagnosis present

## 2017-02-05 DIAGNOSIS — Z87891 Personal history of nicotine dependence: Secondary | ICD-10-CM | POA: Diagnosis not present

## 2017-02-05 DIAGNOSIS — I481 Persistent atrial fibrillation: Secondary | ICD-10-CM | POA: Diagnosis present

## 2017-02-05 DIAGNOSIS — Z77098 Contact with and (suspected) exposure to other hazardous, chiefly nonmedicinal, chemicals: Secondary | ICD-10-CM | POA: Diagnosis present

## 2017-02-05 DIAGNOSIS — J449 Chronic obstructive pulmonary disease, unspecified: Secondary | ICD-10-CM | POA: Diagnosis not present

## 2017-02-05 DIAGNOSIS — K581 Irritable bowel syndrome with constipation: Secondary | ICD-10-CM | POA: Diagnosis present

## 2017-02-05 DIAGNOSIS — J9819 Other pulmonary collapse: Secondary | ICD-10-CM | POA: Diagnosis not present

## 2017-02-05 DIAGNOSIS — I130 Hypertensive heart and chronic kidney disease with heart failure and stage 1 through stage 4 chronic kidney disease, or unspecified chronic kidney disease: Secondary | ICD-10-CM | POA: Diagnosis present

## 2017-02-05 DIAGNOSIS — J9602 Acute respiratory failure with hypercapnia: Secondary | ICD-10-CM | POA: Diagnosis not present

## 2017-02-05 DIAGNOSIS — I2510 Atherosclerotic heart disease of native coronary artery without angina pectoris: Secondary | ICD-10-CM | POA: Diagnosis not present

## 2017-02-05 DIAGNOSIS — Z7901 Long term (current) use of anticoagulants: Secondary | ICD-10-CM | POA: Diagnosis not present

## 2017-02-05 DIAGNOSIS — Z885 Allergy status to narcotic agent status: Secondary | ICD-10-CM | POA: Diagnosis not present

## 2017-02-05 DIAGNOSIS — J90 Pleural effusion, not elsewhere classified: Secondary | ICD-10-CM | POA: Diagnosis not present

## 2017-02-05 DIAGNOSIS — Z951 Presence of aortocoronary bypass graft: Secondary | ICD-10-CM | POA: Diagnosis not present

## 2017-02-05 DIAGNOSIS — M1990 Unspecified osteoarthritis, unspecified site: Secondary | ICD-10-CM | POA: Diagnosis present

## 2017-02-05 DIAGNOSIS — N184 Chronic kidney disease, stage 4 (severe): Secondary | ICD-10-CM | POA: Diagnosis present

## 2017-02-05 DIAGNOSIS — J9811 Atelectasis: Secondary | ICD-10-CM | POA: Diagnosis not present

## 2017-02-05 DIAGNOSIS — R590 Localized enlarged lymph nodes: Secondary | ICD-10-CM | POA: Diagnosis present

## 2017-02-05 DIAGNOSIS — Z66 Do not resuscitate: Secondary | ICD-10-CM | POA: Diagnosis present

## 2017-02-05 DIAGNOSIS — D696 Thrombocytopenia, unspecified: Secondary | ICD-10-CM | POA: Diagnosis present

## 2017-02-05 DIAGNOSIS — R972 Elevated prostate specific antigen [PSA]: Secondary | ICD-10-CM | POA: Diagnosis present

## 2017-02-05 DIAGNOSIS — E871 Hypo-osmolality and hyponatremia: Secondary | ICD-10-CM | POA: Diagnosis not present

## 2017-02-05 DIAGNOSIS — J9601 Acute respiratory failure with hypoxia: Secondary | ICD-10-CM | POA: Diagnosis not present

## 2017-02-05 DIAGNOSIS — I5023 Acute on chronic systolic (congestive) heart failure: Secondary | ICD-10-CM | POA: Diagnosis present

## 2017-02-05 DIAGNOSIS — F329 Major depressive disorder, single episode, unspecified: Secondary | ICD-10-CM | POA: Diagnosis present

## 2017-02-05 DIAGNOSIS — K580 Irritable bowel syndrome with diarrhea: Secondary | ICD-10-CM | POA: Diagnosis present

## 2017-02-05 DIAGNOSIS — R918 Other nonspecific abnormal finding of lung field: Secondary | ICD-10-CM | POA: Diagnosis not present

## 2017-02-05 DIAGNOSIS — J9809 Other diseases of bronchus, not elsewhere classified: Secondary | ICD-10-CM | POA: Diagnosis not present

## 2017-02-05 DIAGNOSIS — Z888 Allergy status to other drugs, medicaments and biological substances status: Secondary | ICD-10-CM | POA: Diagnosis not present

## 2017-02-05 DIAGNOSIS — R0602 Shortness of breath: Secondary | ICD-10-CM | POA: Diagnosis not present

## 2017-03-18 DIAGNOSIS — E1140 Type 2 diabetes mellitus with diabetic neuropathy, unspecified: Secondary | ICD-10-CM | POA: Diagnosis not present

## 2017-03-18 DIAGNOSIS — B351 Tinea unguium: Secondary | ICD-10-CM | POA: Diagnosis not present

## 2017-03-18 DIAGNOSIS — L600 Ingrowing nail: Secondary | ICD-10-CM | POA: Diagnosis not present

## 2017-04-03 DIAGNOSIS — G894 Chronic pain syndrome: Secondary | ICD-10-CM | POA: Diagnosis not present

## 2017-04-03 DIAGNOSIS — M5416 Radiculopathy, lumbar region: Secondary | ICD-10-CM | POA: Diagnosis not present

## 2017-05-07 DEATH — deceased

## 2019-02-05 DEATH — deceased
